# Patient Record
Sex: Female | Born: 1975 | Race: White | Hispanic: No | Marital: Single | Smoking: Current every day smoker
Health system: Southern US, Community
[De-identification: ages and names within clinical notes are randomized; demographics above are authoritative.]

## PROBLEM LIST (undated history)

## (undated) DIAGNOSIS — K219 Gastro-esophageal reflux disease without esophagitis: Secondary | ICD-10-CM

## (undated) DIAGNOSIS — E785 Hyperlipidemia, unspecified: Secondary | ICD-10-CM

## (undated) HISTORY — PX: CHOLECYSTECTOMY: SHX55

---

## 2004-07-27 ENCOUNTER — Other Ambulatory Visit: Payer: Self-pay

## 2004-07-27 ENCOUNTER — Emergency Department: Payer: Self-pay | Admitting: Emergency Medicine

## 2004-11-16 ENCOUNTER — Ambulatory Visit: Payer: Self-pay | Admitting: Unknown Physician Specialty

## 2005-01-06 ENCOUNTER — Emergency Department: Payer: Self-pay | Admitting: Emergency Medicine

## 2005-04-07 ENCOUNTER — Emergency Department: Payer: Self-pay | Admitting: Emergency Medicine

## 2005-05-18 ENCOUNTER — Emergency Department: Payer: Self-pay | Admitting: Emergency Medicine

## 2005-09-26 ENCOUNTER — Emergency Department: Payer: Self-pay | Admitting: Emergency Medicine

## 2007-04-15 ENCOUNTER — Emergency Department: Payer: Self-pay | Admitting: Emergency Medicine

## 2007-04-15 ENCOUNTER — Other Ambulatory Visit: Payer: Self-pay

## 2007-05-07 ENCOUNTER — Emergency Department: Payer: Self-pay | Admitting: Emergency Medicine

## 2007-10-15 ENCOUNTER — Emergency Department: Payer: Self-pay | Admitting: Emergency Medicine

## 2008-01-20 ENCOUNTER — Other Ambulatory Visit: Payer: Self-pay

## 2008-01-20 ENCOUNTER — Emergency Department: Payer: Self-pay | Admitting: Emergency Medicine

## 2008-03-30 ENCOUNTER — Emergency Department: Payer: Self-pay | Admitting: Emergency Medicine

## 2008-04-25 ENCOUNTER — Emergency Department: Payer: Self-pay | Admitting: Unknown Physician Specialty

## 2008-09-25 ENCOUNTER — Emergency Department: Payer: Self-pay | Admitting: Emergency Medicine

## 2009-02-09 ENCOUNTER — Emergency Department: Payer: Self-pay | Admitting: Emergency Medicine

## 2009-04-15 ENCOUNTER — Emergency Department: Payer: Self-pay | Admitting: Emergency Medicine

## 2009-10-18 ENCOUNTER — Emergency Department: Payer: Self-pay | Admitting: Emergency Medicine

## 2010-02-02 ENCOUNTER — Emergency Department: Payer: Self-pay | Admitting: Emergency Medicine

## 2010-03-10 ENCOUNTER — Emergency Department: Payer: Self-pay | Admitting: Internal Medicine

## 2010-05-04 ENCOUNTER — Emergency Department: Payer: Self-pay | Admitting: Emergency Medicine

## 2010-06-13 ENCOUNTER — Emergency Department: Payer: Self-pay | Admitting: Emergency Medicine

## 2010-07-27 ENCOUNTER — Emergency Department: Payer: Self-pay | Admitting: Emergency Medicine

## 2010-09-20 ENCOUNTER — Emergency Department: Payer: Self-pay | Admitting: Emergency Medicine

## 2010-11-10 ENCOUNTER — Observation Stay: Payer: Self-pay | Admitting: General Surgery

## 2011-02-03 ENCOUNTER — Emergency Department: Payer: Self-pay | Admitting: Emergency Medicine

## 2011-06-16 ENCOUNTER — Emergency Department: Payer: Self-pay | Admitting: *Deleted

## 2011-07-10 ENCOUNTER — Ambulatory Visit: Payer: Self-pay | Admitting: Internal Medicine

## 2011-07-12 ENCOUNTER — Ambulatory Visit: Payer: Self-pay

## 2011-07-18 ENCOUNTER — Emergency Department: Payer: Self-pay | Admitting: Emergency Medicine

## 2011-10-03 ENCOUNTER — Ambulatory Visit: Payer: Self-pay | Admitting: Gastroenterology

## 2011-10-19 ENCOUNTER — Ambulatory Visit: Payer: Self-pay

## 2011-10-22 ENCOUNTER — Emergency Department: Payer: Self-pay | Admitting: Internal Medicine

## 2011-12-08 ENCOUNTER — Emergency Department: Payer: Self-pay | Admitting: Emergency Medicine

## 2011-12-09 LAB — URINALYSIS, COMPLETE
Leukocyte Esterase: NEGATIVE
Nitrite: NEGATIVE

## 2012-03-03 ENCOUNTER — Emergency Department: Payer: Self-pay | Admitting: Emergency Medicine

## 2012-04-17 ENCOUNTER — Emergency Department: Payer: Self-pay | Admitting: Emergency Medicine

## 2012-04-17 LAB — BASIC METABOLIC PANEL
Calcium, Total: 8.8 mg/dL (ref 8.5–10.1)
Chloride: 109 mmol/L — ABNORMAL HIGH (ref 98–107)
Co2: 24 mmol/L (ref 21–32)
EGFR (Non-African Amer.): >60
Osmolality: 278 (ref 275–301)

## 2012-04-17 LAB — CBC
HGB: 14.3 g/dL (ref 12.0–16.0)
RBC: 4.77 10*6/uL (ref 3.80–5.20)

## 2012-04-17 LAB — CK TOTAL AND CKMB (NOT AT ARMC)
CK, Total: 87 U/L (ref 21–215)
CK-MB: <0.5 ng/mL — ABNORMAL LOW (ref 0.5–3.6)

## 2012-04-18 LAB — URINALYSIS, COMPLETE
Bilirubin,UR: NEGATIVE
Glucose,UR: NEGATIVE mg/dL (ref 0–75)
Nitrite: NEGATIVE
Ph: 5 (ref 4.5–8.0)
Protein: NEGATIVE
RBC,UR: 4 /HPF (ref 0–5)
WBC UR: 2 /HPF (ref 0–5)

## 2012-05-08 DIAGNOSIS — E669 Obesity, unspecified: Secondary | ICD-10-CM | POA: Insufficient documentation

## 2012-10-28 ENCOUNTER — Emergency Department: Payer: Self-pay | Admitting: Emergency Medicine

## 2012-10-29 ENCOUNTER — Emergency Department (HOSPITAL_COMMUNITY)
Admission: EM | Admit: 2012-10-29 | Discharge: 2012-10-29 | Disposition: A | Discharge status: Home / Self Care | Payer: Medicaid Other | Attending: Emergency Medicine | Admitting: Emergency Medicine

## 2012-10-29 ENCOUNTER — Encounter (HOSPITAL_COMMUNITY): Payer: Self-pay | Admitting: Emergency Medicine

## 2012-10-29 DIAGNOSIS — K0889 Other specified disorders of teeth and supporting structures: Secondary | ICD-10-CM

## 2012-10-29 DIAGNOSIS — K029 Dental caries, unspecified: Secondary | ICD-10-CM

## 2012-10-29 DIAGNOSIS — F17200 Nicotine dependence, unspecified, uncomplicated: Secondary | ICD-10-CM | POA: Insufficient documentation

## 2012-10-29 DIAGNOSIS — K08109 Complete loss of teeth, unspecified cause, unspecified class: Secondary | ICD-10-CM | POA: Insufficient documentation

## 2012-10-29 MED ORDER — IBUPROFEN 800 MG PO TABS: 800.0000 mg | ORAL_TABLET | Freq: Three times a day (TID) | ORAL | Status: DC | PRN

## 2012-10-29 MED ORDER — PENICILLIN V POTASSIUM 500 MG PO TABS: 500.0000 mg | ORAL_TABLET | Freq: Four times a day (QID) | ORAL | Status: AC

## 2012-10-29 MED ORDER — HYDROCODONE-ACETAMINOPHEN 5-325 MG PO TABS: 1.0000 | ORAL_TABLET | ORAL | Status: DC | PRN

## 2012-10-29 NOTE — ED Notes (Signed)
Pt c/o right sided dental pain x 2 days  

## 2012-10-29 NOTE — ED Provider Notes (Signed)
History    This chart was scribed for non-physician practitioner, Trixie Dredge working with Nelia Shi, MD by Smitty Pluck, ED scribe. This patient was seen in room TR04C/TR04C and the patient's care was started at 6:08 PM.   CSN: [id number]  Arrival date & time 10/29/12  1517      Chief Complaint  Patient presents with  . Dental Pain    The history is provided by the patient. No language interpreter was used.   Grace Evans is a 37 y.o. female who presents to the Emergency Department complaining of constant, moderate, aching,  right upper and lower dental pain radiating to bilateral ears onset two days ago. Pt denies trouble swallowing, fever, chills, nausea, vomiting, diarrhea, weakness, cough, SOB and any other pain. Pt reports that she does not have a dentist. She denies any alleviating factors but reports pain is aggravated by chewing food.    History reviewed. No pertinent past medical history.  History reviewed. No pertinent past surgical history.  History reviewed. No pertinent family history.  History  Substance Use Topics  . Smoking status: Current Every Day Smoker  . Smokeless tobacco: Not on file  . Alcohol Use: No    OB History   Grav Para Term Preterm Abortions TAB SAB Ect Mult Living                  Review of Systems  Constitutional: Negative for fever and chills.  HENT: Positive for dental problem. Negative for sore throat and trouble swallowing.   Respiratory: Negative for cough and shortness of breath.   Gastrointestinal: Negative for nausea, vomiting and diarrhea.  Neurological: Negative for weakness.    Allergies  Review of patient's allergies indicates no known allergies.  Home Medications  No current outpatient prescriptions on file.  BP 119/76  Pulse 82  Temp(Src) 97.8 F (36.6 C) (Oral)  Resp 18  SpO2 98%  Physical Exam  Nursing note and vitals reviewed. Constitutional: She appears well-developed and well-nourished. No distress.   HENT:  Head: Normocephalic and atraumatic.  Mouth/Throat: Uvula is midline and oropharynx is clear and moist. Mucous membranes are not dry. Abnormal dentition. Dental caries present. No dental abscesses or edematous. No oropharyngeal exudate, posterior oropharyngeal edema or tonsillar abscesses.    Widespread dental decay and several absent teeth.  Tender to palpation along gums and teeth tender to percussion on upper and lower right side.    Neck: Trachea normal and phonation normal. Neck supple. No tracheal tenderness present. No tracheal deviation present.    Pulmonary/Chest: Effort normal and breath sounds normal. No stridor. No respiratory distress. She has no wheezes. She has no rales.  No stridor   Lymphadenopathy:    She has no cervical adenopathy.  Neurological: She is alert.  Skin: She is not diaphoretic.    ED Course  Procedures (including critical care time) DIAGNOSTIC STUDIES: Oxygen Saturation is 98% on room air, normal by my interpretation.    COORDINATION OF CARE: 6:15 PM Discussed ED treatment with pt and pt agrees.     Labs Reviewed - No data to display No results found.   1. Dental caries   2. Pain, dental       MDM  Pt with severe dental decay throughout mouth.  Tender along right side, upper and lower.  No specific area that is worse than others.  No obvious dental abscess.  Will treat for infection given the sudden increase in pain.  Doubt deeper space infection  such as ludwig's angina. D/C home with pain medication, Penicillin, dental follow up.  I strongly encouraged pt to follow up with dentist.   Discussed diagnosis and treatment plan with patient.  Pt given return precautions.  Pt verbalizes understanding and agrees with plan.     I personally performed the services described in this documentation, which was scribed in my presence. The recorded information has been reviewed and is accurate.        Trixie Dredge, PA-C 10/29/12 1934

## 2012-10-30 NOTE — ED Provider Notes (Signed)
Medical screening examination/treatment/procedure(s) were performed by non-physician practitioner and as supervising physician I was immediately available for consultation/collaboration.   Nelia Shi, MD 10/30/12 2248

## 2013-01-15 ENCOUNTER — Emergency Department: Payer: Self-pay | Admitting: Emergency Medicine

## 2013-01-16 LAB — URINALYSIS, COMPLETE
Bilirubin,UR: NEGATIVE
Blood: NEGATIVE
Glucose,UR: NEGATIVE mg/dL (ref 0–75)
Ketone: NEGATIVE
Nitrite: NEGATIVE
Ph: 7 (ref 4.5–8.0)
Protein: NEGATIVE
RBC,UR: 3 /HPF (ref 0–5)
Specific Gravity: 1.03 (ref 1.003–1.030)
Squamous Epithelial: 9
WBC UR: 4 /HPF (ref 0–5)

## 2013-01-16 LAB — WET PREP, GENITAL

## 2013-02-20 ENCOUNTER — Emergency Department: Payer: Self-pay | Admitting: Emergency Medicine

## 2013-04-21 ENCOUNTER — Emergency Department: Payer: Self-pay | Admitting: Emergency Medicine

## 2013-04-27 ENCOUNTER — Emergency Department: Payer: Self-pay | Admitting: Emergency Medicine

## 2013-04-28 LAB — URINALYSIS, COMPLETE
Bilirubin,UR: NEGATIVE
Glucose,UR: NEGATIVE mg/dL (ref 0–75)
Nitrite: NEGATIVE
Ph: 6 (ref 4.5–8.0)
Protein: NEGATIVE
Squamous Epithelial: 3
WBC UR: 2 /HPF (ref 0–5)

## 2013-04-30 LAB — BETA STREP CULTURE(ARMC)

## 2013-05-08 ENCOUNTER — Other Ambulatory Visit: Payer: Self-pay | Admitting: Physician Assistant

## 2013-05-08 LAB — CBC WITH DIFFERENTIAL/PLATELET
Eosinophil %: 4.2 %
Lymphocyte #: 2.8 10*3/uL (ref 1.0–3.6)
MCH: 30.6 pg (ref 26.0–34.0)
MCHC: 35.2 g/dL (ref 32.0–36.0)
MCV: 87 fL (ref 80–100)
Monocyte %: 7.1 %
Neutrophil %: 60.3 %
Platelet: 306 10*3/uL (ref 150–440)
WBC: 10.4 10*3/uL (ref 3.6–11.0)

## 2013-05-08 LAB — COMPREHENSIVE METABOLIC PANEL
Alkaline Phosphatase: 106 U/L (ref 50–136)
Anion Gap: 5 — ABNORMAL LOW (ref 7–16)
Co2: 26 mmol/L (ref 21–32)
Creatinine: 0.71 mg/dL (ref 0.60–1.30)
EGFR (African American): >60
Glucose: 85 mg/dL (ref 65–99)
Potassium: 4.1 mmol/L (ref 3.5–5.1)
SGOT(AST): 19 U/L (ref 15–37)
Total Protein: 6.7 g/dL (ref 6.4–8.2)

## 2013-05-08 LAB — TSH: Thyroid Stimulating Horm: 0.972 u[IU]/mL

## 2013-09-26 ENCOUNTER — Emergency Department: Payer: Self-pay | Admitting: Emergency Medicine

## 2013-09-26 LAB — CBC WITH DIFFERENTIAL/PLATELET
Basophil #: 0.3 10*3/uL — ABNORMAL HIGH (ref 0.0–0.1)
Basophil %: 2.7 %
Eosinophil #: 0.5 10*3/uL (ref 0.0–0.7)
Eosinophil %: 4.8 %
HCT: 45.1 % (ref 35.0–47.0)
HGB: 15 g/dL (ref 12.0–16.0)
Lymphocyte #: 1.1 10*3/uL (ref 1.0–3.6)
Lymphocyte %: 10.4 %
MCH: 29.5 pg (ref 26.0–34.0)
MCHC: 33.2 g/dL (ref 32.0–36.0)
MCV: 89 fL (ref 80–100)
Monocyte #: 0.6 x10 3/mm (ref 0.2–0.9)
Monocyte %: 5.7 %
Neutrophil #: 7.9 10*3/uL — ABNORMAL HIGH (ref 1.4–6.5)
Neutrophil %: 76.4 %
Platelet: 252 10*3/uL (ref 150–440)
RBC: 5.09 10*6/uL (ref 3.80–5.20)
RDW: 13.2 % (ref 11.5–14.5)
WBC: 10.4 10*3/uL (ref 3.6–11.0)

## 2013-09-26 LAB — COMPREHENSIVE METABOLIC PANEL
Albumin: 3.4 g/dL (ref 3.4–5.0)
Alkaline Phosphatase: 90 U/L
Anion Gap: 6 — ABNORMAL LOW (ref 7–16)
BUN: 7 mg/dL (ref 7–18)
Bilirubin,Total: 1.1 mg/dL — ABNORMAL HIGH (ref 0.2–1.0)
Calcium, Total: 8 mg/dL — ABNORMAL LOW (ref 8.5–10.1)
Chloride: 108 mmol/L — ABNORMAL HIGH (ref 98–107)
Co2: 25 mmol/L (ref 21–32)
Creatinine: 0.71 mg/dL (ref 0.60–1.30)
EGFR (African American): >60
EGFR (Non-African Amer.): >60
Glucose: 119 mg/dL — ABNORMAL HIGH (ref 65–99)
Osmolality: 277 (ref 275–301)
Potassium: 3.4 mmol/L — ABNORMAL LOW (ref 3.5–5.1)
SGOT(AST): 24 U/L (ref 15–37)
SGPT (ALT): 26 U/L (ref 12–78)
Sodium: 139 mmol/L (ref 136–145)
Total Protein: 6.9 g/dL (ref 6.4–8.2)

## 2013-09-26 LAB — URINALYSIS, COMPLETE
Bilirubin,UR: NEGATIVE
Glucose,UR: NEGATIVE mg/dL (ref 0–75)
Ketone: NEGATIVE
Nitrite: NEGATIVE
Ph: 5 (ref 4.5–8.0)
Protein: 30
RBC,UR: 3 /HPF (ref 0–5)
Specific Gravity: 1.026 (ref 1.003–1.030)
Squamous Epithelial: 5
WBC UR: 4 /HPF (ref 0–5)

## 2013-09-26 LAB — LIPASE, BLOOD: Lipase: 70 U/L — ABNORMAL LOW (ref 73–393)

## 2013-10-08 ENCOUNTER — Emergency Department: Payer: Self-pay | Admitting: Emergency Medicine

## 2013-11-24 ENCOUNTER — Emergency Department: Payer: Self-pay | Admitting: Emergency Medicine

## 2014-02-17 ENCOUNTER — Emergency Department: Payer: Self-pay | Admitting: Emergency Medicine

## 2014-04-26 ENCOUNTER — Emergency Department: Payer: Self-pay | Admitting: Internal Medicine

## 2014-07-23 ENCOUNTER — Emergency Department: Payer: Self-pay | Admitting: Emergency Medicine

## 2014-10-02 ENCOUNTER — Emergency Department: Payer: Self-pay | Admitting: Student

## 2014-10-30 ENCOUNTER — Emergency Department: Payer: Self-pay | Admitting: Emergency Medicine

## 2014-10-31 LAB — COMPREHENSIVE METABOLIC PANEL
ANION GAP: 3 — AB (ref 7–16)
AST: 16 U/L
Albumin: 3.7 g/dL
Alkaline Phosphatase: 74 U/L
BILIRUBIN TOTAL: 0.5 mg/dL
BUN: 5 mg/dL — AB
CHLORIDE: 107 mmol/L
CREATININE: 0.74 mg/dL
Calcium, Total: 8.5 mg/dL — ABNORMAL LOW
Co2: 28 mmol/L
Glucose: 105 mg/dL — ABNORMAL HIGH
Potassium: 3.7 mmol/L
SGPT (ALT): 18 U/L
Sodium: 138 mmol/L
TOTAL PROTEIN: 6.5 g/dL

## 2014-10-31 LAB — URINALYSIS, COMPLETE
BILIRUBIN, UR: NEGATIVE
BLOOD: NEGATIVE
Glucose,UR: NEGATIVE mg/dL (ref 0–75)
Ketone: NEGATIVE
LEUKOCYTE ESTERASE: NEGATIVE
NITRITE: NEGATIVE
PH: 6 (ref 4.5–8.0)
Protein: NEGATIVE
Specific Gravity: 1.008 (ref 1.003–1.030)
WBC UR: 1 /HPF (ref 0–5)

## 2014-10-31 LAB — CBC WITH DIFFERENTIAL/PLATELET
BASOS ABS: 0.1 10*3/uL (ref 0.0–0.1)
BASOS PCT: 1.1 %
EOS ABS: 0.5 10*3/uL (ref 0.0–0.7)
Eosinophil %: 4.3 %
HCT: 43.9 % (ref 35.0–47.0)
HGB: 14.6 g/dL (ref 12.0–16.0)
LYMPHS PCT: 32.7 %
Lymphocyte #: 4.1 10*3/uL — ABNORMAL HIGH (ref 1.0–3.6)
MCH: 29.5 pg (ref 26.0–34.0)
MCHC: 33.2 g/dL (ref 32.0–36.0)
MCV: 89 fL (ref 80–100)
MONOS PCT: 7.4 %
Monocyte #: 0.9 x10 3/mm (ref 0.2–0.9)
NEUTROS PCT: 54.5 %
Neutrophil #: 6.8 10*3/uL — ABNORMAL HIGH (ref 1.4–6.5)
Platelet: 267 10*3/uL (ref 150–440)
RBC: 4.93 10*6/uL (ref 3.80–5.20)
RDW: 13.2 % (ref 11.5–14.5)
WBC: 12.5 10*3/uL — AB (ref 3.6–11.0)

## 2014-10-31 LAB — LIPASE, BLOOD: Lipase: 23 U/L

## 2014-10-31 LAB — PREGNANCY, URINE: Pregnancy Test, Urine: NEGATIVE m[IU]/mL

## 2015-01-05 ENCOUNTER — Emergency Department
Admission: EM | Admit: 2015-01-05 | Discharge: 2015-01-05 | Disposition: A | Discharge status: Home / Self Care | Payer: Self-pay | Attending: Emergency Medicine | Admitting: Emergency Medicine

## 2015-01-05 ENCOUNTER — Emergency Department: Payer: Self-pay

## 2015-01-05 ENCOUNTER — Encounter: Payer: Self-pay | Admitting: Emergency Medicine

## 2015-01-05 DIAGNOSIS — N23 Unspecified renal colic: Secondary | ICD-10-CM | POA: Insufficient documentation

## 2015-01-05 DIAGNOSIS — Z720 Tobacco use: Secondary | ICD-10-CM | POA: Insufficient documentation

## 2015-01-05 DIAGNOSIS — R109 Unspecified abdominal pain: Secondary | ICD-10-CM

## 2015-01-05 DIAGNOSIS — Z3202 Encounter for pregnancy test, result negative: Secondary | ICD-10-CM | POA: Insufficient documentation

## 2015-01-05 DIAGNOSIS — N390 Urinary tract infection, site not specified: Secondary | ICD-10-CM | POA: Insufficient documentation

## 2015-01-05 LAB — COMPREHENSIVE METABOLIC PANEL
ALBUMIN: 3.6 g/dL (ref 3.5–5.0)
ALT: 18 U/L (ref 14–54)
ANION GAP: 9 (ref 5–15)
AST: 18 U/L (ref 15–41)
Alkaline Phosphatase: 86 U/L (ref 38–126)
BILIRUBIN TOTAL: 0.7 mg/dL (ref 0.3–1.2)
BUN: 5 mg/dL — AB (ref 6–20)
CO2: 26 mmol/L (ref 22–32)
CREATININE: 0.79 mg/dL (ref 0.44–1.00)
Calcium: 8.8 mg/dL — ABNORMAL LOW (ref 8.9–10.3)
Chloride: 108 mmol/L (ref 101–111)
Glucose, Bld: 103 mg/dL — ABNORMAL HIGH (ref 65–99)
POTASSIUM: 3.4 mmol/L — AB (ref 3.5–5.1)
Sodium: 143 mmol/L (ref 135–145)
Total Protein: 6.8 g/dL (ref 6.5–8.1)

## 2015-01-05 LAB — URINALYSIS COMPLETE WITH MICROSCOPIC (ARMC ONLY)
Bilirubin Urine: NEGATIVE
Glucose, UA: NEGATIVE mg/dL
Hgb urine dipstick: NEGATIVE
Ketones, ur: NEGATIVE mg/dL
Nitrite: NEGATIVE
Protein, ur: NEGATIVE mg/dL
Specific Gravity, Urine: 1.025 (ref 1.005–1.030)
pH: 6 (ref 5.0–8.0)

## 2015-01-05 LAB — CBC WITH DIFFERENTIAL/PLATELET
Basophils Absolute: 0.1 10*3/uL (ref 0–0.1)
Basophils Relative: 1 %
Eosinophils Absolute: 0.5 10*3/uL (ref 0–0.7)
Eosinophils Relative: 6 %
HCT: 42.5 % (ref 35.0–47.0)
Hemoglobin: 14.5 g/dL (ref 12.0–16.0)
Lymphocytes Relative: 31 %
Lymphs Abs: 2.7 10*3/uL (ref 1.0–3.6)
MCH: 30 pg (ref 26.0–34.0)
MCHC: 34.1 g/dL (ref 32.0–36.0)
MCV: 88 fL (ref 80.0–100.0)
Monocytes Absolute: 0.8 10*3/uL (ref 0.2–0.9)
Monocytes Relative: 9 %
Neutro Abs: 4.7 10*3/uL (ref 1.4–6.5)
Neutrophils Relative %: 53 %
Platelets: 242 10*3/uL (ref 150–440)
RBC: 4.83 MIL/uL (ref 3.80–5.20)
RDW: 13.2 % (ref 11.5–14.5)
WBC: 8.8 10*3/uL (ref 3.6–11.0)

## 2015-01-05 LAB — LIPASE, BLOOD: Lipase: 25 U/L (ref 22–51)

## 2015-01-05 LAB — POCT PREGNANCY, URINE: Preg Test, Ur: NEGATIVE

## 2015-01-05 MED ORDER — SODIUM CHLORIDE 0.9 % IV SOLN
Freq: Once | INTRAVENOUS | Status: AC
  Administered 2015-01-05: 1000 mL via INTRAVENOUS

## 2015-01-05 MED ORDER — KETOROLAC TROMETHAMINE 30 MG/ML IJ SOLN
INTRAMUSCULAR | Status: AC
  Administered 2015-01-05: 30 mg via INTRAVENOUS
  Filled 2015-01-05: qty 1

## 2015-01-05 MED ORDER — MORPHINE SULFATE 4 MG/ML IJ SOLN
INTRAMUSCULAR | Status: AC
  Filled 2015-01-05: qty 1

## 2015-01-05 MED ORDER — KETOROLAC TROMETHAMINE 30 MG/ML IJ SOLN
30.0000 mg | Freq: Once | INTRAMUSCULAR | Status: AC
  Administered 2015-01-05: 30 mg via INTRAVENOUS

## 2015-01-05 MED ORDER — CIPROFLOXACIN HCL 500 MG PO TABS: 500.0000 mg | ORAL_TABLET | Freq: Two times a day (BID) | ORAL | Status: AC

## 2015-01-05 MED ORDER — ONDANSETRON HCL 4 MG/2ML IJ SOLN
INTRAMUSCULAR | Status: AC
  Administered 2015-01-05: 4 mg via INTRAVENOUS
  Filled 2015-01-05: qty 2

## 2015-01-05 MED ORDER — ONDANSETRON 4 MG PO TBDP: 4.0000 mg | ORAL_TABLET | Freq: Three times a day (TID) | ORAL | Status: DC | PRN

## 2015-01-05 MED ORDER — MORPHINE SULFATE 4 MG/ML IJ SOLN: 4.0000 mg | Freq: Once | INTRAMUSCULAR | Status: AC

## 2015-01-05 MED ORDER — OXYCODONE-ACETAMINOPHEN 5-325 MG PO TABS: 1.0000 | ORAL_TABLET | Freq: Three times a day (TID) | ORAL | Status: DC | PRN

## 2015-01-05 MED ORDER — ONDANSETRON HCL 4 MG/2ML IJ SOLN: 4.0000 mg | Freq: Once | INTRAMUSCULAR | Status: AC

## 2015-01-05 NOTE — Discharge Instructions (Signed)
Take medication as prescribed. Drink plenty of water and fluids. Rest. Avoid strenuous activity.  Follow-up with your primary care physician in one to 2 days. Follow-up with the above as needed.  Return to the ER for new or worsening concerns.  Urinary Tract Infection A urinary tract infection (UTI) can occur any place along the urinary tract. The tract includes the kidneys, ureters, bladder, and urethra. A type of germ called bacteria often causes a UTI. UTIs are often helped with antibiotic medicine.  HOME CARE   If given, take antibiotics as told by your doctor. Finish them even if you start to feel better.  Drink enough fluids to keep your pee (urine) clear or pale yellow.  Avoid tea, drinks with caffeine, and bubbly (carbonated) drinks.  Pee often. Avoid holding your pee in for a long time.  Pee before and after having sex (intercourse).  Wipe from front to back after you poop (bowel movement) if you are a woman. Use each tissue only once. GET HELP RIGHT AWAY IF:   You have back pain.  You have lower belly (abdominal) pain.  You have chills.  You feel sick to your stomach (nauseous).  You throw up (vomit).  Your burning or discomfort with peeing does not go away.  You have a fever.  Your symptoms are not better in 3 days. MAKE SURE YOU:   Understand these instructions.  Will watch your condition.  Will get help right away if you are not doing well or get worse. Document Released: 01/10/2008 Document Revised: 04/17/2012 Document Reviewed: 02/22/2012 [facility] Patient Information 2015 [place], [place]. This information is not intended to replace advice given to you by your health care provider. Make sure you discuss any questions you have with your health care provider.   Flank Pain Flank pain refers to pain that is located on the side of the body between the upper abdomen and the back. The pain may occur over a short period of time (acute) or may be long-term or  reoccurring (chronic). It may be mild or severe. Flank pain can be caused by many things. CAUSES  Some of the more common causes of flank pain include:  Muscle strains.   Muscle spasms.   A disease of your spine (vertebral disk disease).   A lung infection (pneumonia).   Fluid around your lungs (pulmonary edema).   A kidney infection.   Kidney stones.   A very painful skin rash caused by the chickenpox virus (shingles).   Gallbladder disease.  HOME CARE INSTRUCTIONS  Home care will depend on the cause of your pain. In general,  Rest as directed by your caregiver.  Drink enough fluids to keep your urine clear or pale yellow.  Only take over-the-counter or prescription medicines as directed by your caregiver. Some medicines may help relieve the pain.  Tell your caregiver about any changes in your pain.  Follow up with your caregiver as directed. SEEK IMMEDIATE MEDICAL CARE IF:   Your pain is not controlled with medicine.   You have new or worsening symptoms.  Your pain increases.   You have abdominal pain.   You have shortness of breath.   You have persistent nausea or vomiting.   You have swelling in your abdomen.   You feel faint or pass out.   You have blood in your urine.  You have a fever or persistent symptoms for more than 2-3 days.  You have a fever and your symptoms suddenly get worse. MAKE  SURE YOU:   Understand these instructions.  Will watch your condition.  Will get help right away if you are not doing well or get worse. Document Released: 09/14/2005 Document Revised: 04/17/2012 Document Reviewed: 03/07/2012 [facility] Patient Information 2015 [place]. This information is not intended to replace advice given to you by your health care provider. Make sure you discuss any questions you have with your health care provider.

## 2015-01-05 NOTE — ED Provider Notes (Signed)
[facility] Emergency Department Provider Note  ____________________________________________  Time seen: Approximately 7:27 PM  I have reviewed the triage vital signs and the nursing notes.   HISTORY  Chief Complaint Back Pain   HPI Grace Evans is a 39 y.o. female presents to the ER for complaints of bilateral flank pain, right greater than left. Patient states that pain has been present for approximately 2 days. Denies fall or injury. States mostly in right side. Denies movement increasing pain. States no pain to palpation.States she has had similar in past with kidney stones. States that she was diagnosed with kidney stones 3-4 months ago.  Denies fever, dysuria, vaginal discharge, vaginal bleeding, pain radiation, abdominal pain.  States pain 6 out of 10 aching.    History reviewed. No pertinent past medical history. Kidney stones There are no active problems to display for this patient.   Past Surgical History  Procedure Laterality Date  . Cholecystectomy      Current Outpatient Rx  Name  Route  Sig  Dispense  Refill  .            .         0   .         0     Allergies Review of patient's allergies indicates no known allergies.  History reviewed. No pertinent family history.  Social History History  Substance Use Topics  . Smoking status: Current Every Day Smoker  . Smokeless tobacco: Not on file  . Alcohol Use: No    Review of Systems Constitutional: No fever/chills Eyes: No visual changes. ENT: No sore throat. Cardiovascular: Denies chest pain. Respiratory: Denies shortness of breath. Gastrointestinal: No abdominal pain.  No nausea, no vomiting.  No diarrhea.  No constipation. Genitourinary: Negative for dysuria. Musculoskeletal: Positive for flank pain. Skin: Negative for rash. Neurological: Negative for headaches, focal weakness or numbness.  10-point ROS otherwise  negative.  ____________________________________________   PHYSICAL EXAM:  VITAL SIGNS: ED Triage Vitals  Enc Vitals Group     BP 01/05/15 1820 117/90 mmHg     Pulse Rate 01/05/15 1820 99     Resp 01/05/15 1820 18     Temp 01/05/15 1820 98.2 F (36.8 C)     Temp Source 01/05/15 1820 Oral     SpO2 01/05/15 1820 100 %     Weight 01/05/15 1820 215 lb (97.523 kg)     Height 01/05/15 1820 5\' 2"  (1.575 m)     Head Cir --      Peak Flow --      Pain Score 01/05/15 1820 6     Pain Loc --      Pain Edu? --      Excl. in GC? --     Constitutional: Alert and oriented. Well appearing and in no acute distress. Eyes: Conjunctivae are normal. PERRL. EOMI. Head: Atraumatic. Nose: No congestion/rhinnorhea. Mouth/Throat: Mucous membranes are moist.  Oropharynx non-erythematous. Neck: No stridor.  No cervical spine tenderness to palpation. Hematological/Lymphatic/Immunilogical: No cervical lymphadenopathy. Cardiovascular: Normal rate, regular rhythm. Grossly normal heart sounds.  Good peripheral circulation. Respiratory: Normal respiratory effort.  No retractions. Lungs CTAB. Gastrointestinal: Soft and nontender. No distention. No abdominal bruits. Mild right CVA tenderness.No left CVA tenderness. No midline tenderness or pain. No change in pain with flexion, twisting, or other range of motion. Musculoskeletal: No lower extremity tenderness nor edema.  No joint effusions. Neurologic:  Normal speech and language. No gross focal neurologic deficits  are appreciated. Speech is normal. No gait instability. Skin:  Skin is warm, dry and intact. No rash noted. Psychiatric: Mood and affect are normal. Speech and behavior are normal.  ____________________________________________   LABS (all labs ordered are listed, but only abnormal results are displayed)  Labs Reviewed  URINALYSIS COMPLETEWITH MICROSCOPIC ([facility] ONLY) - Abnormal; Notable for the following:    Color, Urine YELLOW (*)     APPearance HAZY (*)    Leukocytes, UA TRACE (*)    Bacteria, UA FEW (*)    Squamous Epithelial / LPF 6-30 (*)    All other components within normal limits RBC 6-30  COMPREHENSIVE METABOLIC PANEL - Abnormal; Notable for the following:    Potassium 3.4 (*)    Glucose, Bld 103 (*)    BUN 5 (*)    Calcium 8.8 (*)    All other components within normal limits  CBC WITH DIFFERENTIAL/PLATELET  LIPASE, BLOOD  POCT PREGNANCY, URINE   _________________________________________  RADIOLOGY  RENAL / URINARY TRACT ULTRASOUND COMPLETE  COMPARISON: None.  FINDINGS: Right Kidney:  Length: 11 cm. Echogenicity within normal limits. No mass or hydronephrosis visualized.  Left Kidney:  Length: 10.8 cm. Echogenicity within normal limits. No mass or hydronephrosis visualized.  Bladder:  Appears normal for degree of bladder distention.  IMPRESSION: No acute abnormality noted.   Electronically Signed By: Alcide Clever M.D. On: 01/05/2015 21:38  ____________________________________________   INITIAL IMPRESSION / ASSESSMENT AND PLAN / ED COURSE  Pertinent labs & imaging results that were available during my care of the patient were reviewed by me and considered in my medical decision making (see chart for details).  Presents to the ER for complaints of back pain and flank pain. Denies abdominal pain, fever, vomiting, diarrhea or other complaints. Reports history of kidney stones and this feels very similar. Denies urinary changes.tolerating PO fluids and foods well.  Ultrasound negative for acute abnormality. Patient with urinary tract infection, and suspect patient may have small stone as well. . Will treat patient with oral antibiotics. When necessary pain medication. Discussed very strict follow-up and return parameters. Patient to follow up with her physician in one to 2 days. Patient agreed to plan.   ____________________________________________   FINAL CLINICAL IMPRESSION(S)  / ED DIAGNOSES  Final diagnoses:  Flank pain  UTI (lower urinary tract infection)  Renal colic on right side      Renford Dills, NP 01/05/15 2315  Governor Rooks, MD 01/06/15 [phone]

## 2015-01-05 NOTE — ED Notes (Signed)
Bilateral flank pain and mid back pain. Denies urinary sx. Pt alert and oriented X4, active, cooperative, pt in NAD. RR even and unlabored, color WNL.

## 2015-01-05 NOTE — ED Notes (Signed)
Pt to ed with c/o bilat lower back pain and flank pain that started today.  Pt states "it catches" with certain types of movement.  Pt denies difficulty or pain with urination.  Pt appears in nad at triage.

## 2015-01-17 ENCOUNTER — Encounter (HOSPITAL_COMMUNITY): Payer: Self-pay | Admitting: *Deleted

## 2015-01-17 ENCOUNTER — Emergency Department (HOSPITAL_COMMUNITY)
Admission: EM | Admit: 2015-01-17 | Discharge: 2015-01-17 | Disposition: A | Discharge status: Home / Self Care | Payer: Medicaid Other | Attending: Emergency Medicine | Admitting: Emergency Medicine

## 2015-01-17 DIAGNOSIS — E876 Hypokalemia: Secondary | ICD-10-CM | POA: Insufficient documentation

## 2015-01-17 DIAGNOSIS — R1110 Vomiting, unspecified: Secondary | ICD-10-CM | POA: Insufficient documentation

## 2015-01-17 DIAGNOSIS — Z9049 Acquired absence of other specified parts of digestive tract: Secondary | ICD-10-CM | POA: Insufficient documentation

## 2015-01-17 DIAGNOSIS — Z720 Tobacco use: Secondary | ICD-10-CM | POA: Insufficient documentation

## 2015-01-17 DIAGNOSIS — R1013 Epigastric pain: Secondary | ICD-10-CM | POA: Insufficient documentation

## 2015-01-17 DIAGNOSIS — Z3202 Encounter for pregnancy test, result negative: Secondary | ICD-10-CM | POA: Insufficient documentation

## 2015-01-17 LAB — CBC WITH DIFFERENTIAL/PLATELET
BASOS PCT: 0 % (ref 0–1)
Basophils Absolute: 0 10*3/uL (ref 0.0–0.1)
EOS ABS: 0.5 10*3/uL (ref 0.0–0.7)
Eosinophils Relative: 4 % (ref 0–5)
HCT: 44.6 % (ref 36.0–46.0)
Hemoglobin: 15.4 g/dL — ABNORMAL HIGH (ref 12.0–15.0)
Lymphocytes Relative: 23 % (ref 12–46)
Lymphs Abs: 3.1 10*3/uL (ref 0.7–4.0)
MCH: 29.4 pg (ref 26.0–34.0)
MCHC: 34.5 g/dL (ref 30.0–36.0)
MCV: 85.3 fL (ref 78.0–100.0)
Monocytes Absolute: 0.8 10*3/uL (ref 0.1–1.0)
Monocytes Relative: 6 % (ref 3–12)
NEUTROS PCT: 67 % (ref 43–77)
Neutro Abs: 8.9 10*3/uL — ABNORMAL HIGH (ref 1.7–7.7)
Platelets: 304 10*3/uL (ref 150–400)
RBC: 5.23 MIL/uL — ABNORMAL HIGH (ref 3.87–5.11)
RDW: 12.5 % (ref 11.5–15.5)
WBC: 13.3 10*3/uL — ABNORMAL HIGH (ref 4.0–10.5)

## 2015-01-17 LAB — URINALYSIS, ROUTINE W REFLEX MICROSCOPIC
Glucose, UA: NEGATIVE mg/dL
Hgb urine dipstick: NEGATIVE
Ketones, ur: 15 mg/dL — AB
Nitrite: NEGATIVE
PROTEIN: 30 mg/dL — AB
SPECIFIC GRAVITY, URINE: 1.04 — AB (ref 1.005–1.030)
Urobilinogen, UA: 1 mg/dL (ref 0.0–1.0)
pH: 5.5 (ref 5.0–8.0)

## 2015-01-17 LAB — COMPREHENSIVE METABOLIC PANEL
ALBUMIN: 3.4 g/dL — AB (ref 3.5–5.0)
ALT: 17 U/L (ref 14–54)
AST: 21 U/L (ref 15–41)
Alkaline Phosphatase: 84 U/L (ref 38–126)
Anion gap: 10 (ref 5–15)
BUN: <5 mg/dL — ABNORMAL LOW (ref 6–20)
CHLORIDE: 107 mmol/L (ref 101–111)
CO2: 20 mmol/L — AB (ref 22–32)
Calcium: 8.7 mg/dL — ABNORMAL LOW (ref 8.9–10.3)
Creatinine, Ser: 0.71 mg/dL (ref 0.44–1.00)
GFR calc Af Amer: >60 mL/min (ref >60)
Glucose, Bld: 152 mg/dL — ABNORMAL HIGH (ref 65–99)
Potassium: 3 mmol/L — ABNORMAL LOW (ref 3.5–5.1)
Sodium: 137 mmol/L (ref 135–145)
TOTAL PROTEIN: 6.3 g/dL — AB (ref 6.5–8.1)
Total Bilirubin: 0.6 mg/dL (ref 0.3–1.2)

## 2015-01-17 LAB — URINE MICROSCOPIC-ADD ON

## 2015-01-17 LAB — LIPASE, BLOOD: Lipase: 16 U/L — ABNORMAL LOW (ref 22–51)

## 2015-01-17 LAB — POC URINE PREG, ED: Preg Test, Ur: NEGATIVE

## 2015-01-17 MED ORDER — OMEPRAZOLE 20 MG PO CPDR: 20.0000 mg | DELAYED_RELEASE_CAPSULE | Freq: Every day | ORAL | Status: DC

## 2015-01-17 MED ORDER — PANTOPRAZOLE SODIUM 20 MG PO TBEC
20.0000 mg | DELAYED_RELEASE_TABLET | Freq: Once | ORAL | Status: AC
  Administered 2015-01-17: 20 mg via ORAL
  Filled 2015-01-17: qty 1

## 2015-01-17 MED ORDER — ONDANSETRON 4 MG PO TBDP
8.0000 mg | ORAL_TABLET | Freq: Once | ORAL | Status: AC
  Administered 2015-01-17: 8 mg via ORAL
  Filled 2015-01-17: qty 2

## 2015-01-17 MED ORDER — OXYCODONE-ACETAMINOPHEN 5-325 MG PO TABS
1.0000 | ORAL_TABLET | Freq: Once | ORAL | Status: AC
  Administered 2015-01-17: 1 via ORAL
  Filled 2015-01-17: qty 1

## 2015-01-17 MED ORDER — GI COCKTAIL ~~LOC~~
30.0000 mL | Freq: Once | ORAL | Status: AC
  Administered 2015-01-17: 30 mL via ORAL
  Filled 2015-01-17: qty 30

## 2015-01-17 MED ORDER — POTASSIUM CHLORIDE ER 10 MEQ PO TBCR: 20.0000 meq | EXTENDED_RELEASE_TABLET | Freq: Two times a day (BID) | ORAL | Status: DC

## 2015-01-17 NOTE — ED Notes (Signed)
Pt stable, ambulatory, states understanding of discharge instructions 

## 2015-01-17 NOTE — ED Notes (Signed)
Pt ambulated to restroom. 

## 2015-01-17 NOTE — ED Provider Notes (Deleted)
39 year old female comes in with two-day history of epigastric pain. She has been having problems with persistent vomiting and had been advised to go on a proton pump inhibitor but did not get the prescription filled because she thought it was too expensive. On exam, there is some mild to moderate epigastric tenderness but no rebound or guarding. Bowel sounds are decreased. Laboratory workup does show mild leukocytosis which is unchanged from her baseline and there is no left shift. Potassium is low at 3.0 probably related to her known problems with vomiting and she will need potassium supplementation. She is advised to take omeprazole which is unavailable for $4 per month and [facility]  I saw and evaluated the patient, reviewed the resident's note and I agree with the findings and plan.   EKG Interpretation   Date/Time:  Sunday January 17 2015 16:19:10 EDT Ventricular Rate:  98 PR Interval:  130 QRS Duration: 80 QT Interval:  368 QTC Calculation: 469 R Axis:   -33 Text Interpretation:  Normal sinus rhythm Left axis deviation Possible  Inferior infarct , age undetermined Cannot rule out Anterior infarct , age  undetermined Abnormal ECG When compared with ECG of 09/26/2013, No  significant change was found Confirmed by [facility]  MD, Lidya Mccalister ([id number]) on  01/17/2015 7:57:16 PM        Dione Booze, MD 01/17/15 2002

## 2015-01-17 NOTE — ED Notes (Signed)
The pt is c/o epigastric pain with nv.  No gb  .  She has these attacks since her gb was removed  lmp none  bc

## 2015-01-17 NOTE — ED Provider Notes (Signed)
CSN: [id number]     Arrival date & time 01/17/15  1602 History   First MD Initiated Contact with Patient 01/17/15 1837     Chief Complaint  Patient presents with  . Abdominal Pain     (Consider location/radiation/quality/duration/timing/severity/associated sxs/prior Treatment) Patient is a 39 y.o. female presenting with abdominal pain. The history is provided by the patient.  Abdominal Pain Pain location:  Epigastric Pain quality: aching   Pain radiates to:  Does not radiate Pain severity:  Moderate Onset quality:  Gradual Duration:  1 day Timing:  Constant Progression:  Worsening Chronicity:  New Context: previous surgery (chole 2 y/a)   Context: not alcohol use and not eating   Relieved by:  Nothing Worsened by:  Nothing tried Ineffective treatments:  None tried Associated symptoms: vomiting (daily for 2-3 years)   Associated symptoms: no anorexia, no constipation, no diarrhea and no fever     History reviewed. No pertinent past medical history. Past Surgical History  Procedure Laterality Date  . Cholecystectomy     No family history on file. History  Substance Use Topics  . Smoking status: Current Every Day Smoker  . Smokeless tobacco: Not on file  . Alcohol Use: No   OB History    Gravida Para Term Preterm AB TAB SAB Ectopic Multiple Living   2    1     1      Review of Systems  Constitutional: Negative for fever.  Gastrointestinal: Positive for vomiting (daily for 2-3 years) and abdominal pain. Negative for diarrhea, constipation and anorexia.  All other systems reviewed and are negative.     Allergies  Review of patient's allergies indicates no known allergies.  Home Medications   Prior to Admission medications   Medication Sig Start Date End Date Taking? Authorizing Provider  omeprazole (PRILOSEC) 20 MG capsule Take 1 capsule (20 mg total) by mouth daily. 01/17/15   Lyndal Pulley, MD  ondansetron (ZOFRAN ODT) 4 MG disintegrating tablet Take 1 tablet  (4 mg total) by mouth every 8 (eight) hours as needed for nausea or vomiting. 01/05/15   Renford Dills, NP  oxyCODONE-acetaminophen (ROXICET) 5-325 MG per tablet Take 1 tablet by mouth every 8 (eight) hours as needed for moderate pain or severe pain (Do not drive or operate heavy machinery while taking as can cause drowsiness.). 01/05/15   Renford Dills, NP  potassium chloride (K-DUR) 10 MEQ tablet Take 2 tablets (20 mEq total) by mouth 2 (two) times daily. 01/17/15   Lyndal Pulley, MD   BP 107/78 mmHg  Pulse 71  Temp(Src) 98 F (36.7 C) (Oral)  Resp 14  Ht 5\' 2"  (1.575 m)  Wt 208 lb 1.6 oz (94.394 kg)  BMI 38.05 kg/m2  SpO2 100% Physical Exam  Constitutional: She is oriented to person, place, and time. She appears well-developed and well-nourished. No distress.  HENT:  Head: Normocephalic.  Eyes: Conjunctivae are normal.  Neck: Neck supple. No tracheal deviation present.  Cardiovascular: Normal rate and regular rhythm.   Pulmonary/Chest: Effort normal. No respiratory distress.  Abdominal: Soft. She exhibits no distension. There is tenderness in the epigastric area. There is no rigidity and no guarding.  Neurological: She is alert and oriented to person, place, and time.  Skin: Skin is warm and dry.  Psychiatric: She has a normal mood and affect.    ED Course  Procedures (including critical care time) Labs Review Labs Reviewed  CBC WITH DIFFERENTIAL/PLATELET - Abnormal; Notable for the following:  WBC 13.3 (*)    RBC 5.23 (*)    Hemoglobin 15.4 (*)    Neutro Abs 8.9 (*)    All other components within normal limits  COMPREHENSIVE METABOLIC PANEL - Abnormal; Notable for the following:    Potassium 3.0 (*)    CO2 20 (*)    Glucose, Bld 152 (*)    BUN <5 (*)    Calcium 8.7 (*)    Total Protein 6.3 (*)    Albumin 3.4 (*)    All other components within normal limits  LIPASE, BLOOD - Abnormal; Notable for the following:    Lipase 16 (*)    All other components within normal  limits  URINALYSIS, ROUTINE W REFLEX MICROSCOPIC (NOT AT [facility]) - Abnormal; Notable for the following:    Color, Urine ORANGE (*)    APPearance CLOUDY (*)    Specific Gravity, Urine 1.040 (*)    Bilirubin Urine SMALL (*)    Ketones, ur 15 (*)    Protein, ur 30 (*)    Leukocytes, UA TRACE (*)    All other components within normal limits  URINE MICROSCOPIC-ADD ON - Abnormal; Notable for the following:    Squamous Epithelial / LPF FEW (*)    All other components within normal limits  POC URINE PREG, ED    Imaging Review No results found.   EKG Interpretation   Date/Time:  Sunday January 17 2015 16:19:10 EDT Ventricular Rate:  98 PR Interval:  130 QRS Duration: 80 QT Interval:  368 QTC Calculation: 469 R Axis:   -33 Text Interpretation:  Normal sinus rhythm Left axis deviation Possible  Inferior infarct , age undetermined Cannot rule out Anterior infarct , age  undetermined Abnormal ECG When compared with ECG of 09/26/2013, No  significant change was found Confirmed by [facility]  MD, DAVID ([id number]) on  01/17/2015 7:57:16 PM      MDM   Final diagnoses:  Epigastric abdominal pain  Hypokalemia   39 year old female presents with epigastric abdominal pain that has been occurring intermittently for the last 2 days. She has had a stated history of 2-3 years of daily Singulair episodes of vomiting. She has been seen by multiple providers and prescribed a PPI but has been noncompliant with this therapy due to perceived cost difficulty. I splinted the patient that her symptoms are consistent with reflux related etiologies for pain including peptic ulcer disease or reflux esophagitis but she has no current signs of peritonitis, no significant lab abnormalities except for mild hypokalemia which we will replete orally and I recommended that she establish a primary care physician and start taking a PPI as previously instructed which is available at low cost.   Lyndal Pulley, MD 01/18/15  0107  Dione Booze, MD 01/18/15 1505

## 2015-01-17 NOTE — ED Notes (Signed)
The pt reports that she has been all over with these same symptoms  She vomits every am

## 2015-01-17 NOTE — Discharge Instructions (Signed)
Abdominal Pain °Many things can cause abdominal pain. Usually, abdominal pain is not caused by a disease and will improve without treatment. It can often be observed and treated at home. Your health care provider will do a physical exam and possibly order blood tests and X-rays to help determine the seriousness of your pain. However, in many cases, more time must pass before a clear cause of the pain can be found. Before that point, your health care provider may not know if you need more testing or further treatment. °HOME CARE INSTRUCTIONS  °Monitor your abdominal pain for any changes. The following actions may help to alleviate any discomfort you are experiencing: °· Only take over-the-counter or prescription medicines as directed by your health care provider. °· Do not take laxatives unless directed to do so by your health care provider. °· Try a clear liquid diet (broth, tea, or water) as directed by your health care provider. Slowly move to a bland diet as tolerated. °SEEK MEDICAL CARE IF: °· You have unexplained abdominal pain. °· You have abdominal pain associated with nausea or diarrhea. °· You have pain when you urinate or have a bowel movement. °· You experience abdominal pain that wakes you in the night. °· You have abdominal pain that is worsened or improved by eating food. °· You have abdominal pain that is worsened with eating fatty foods. °· You have a fever. °SEEK IMMEDIATE MEDICAL CARE IF:  °· Your pain does not go away within 2 hours. °· You keep throwing up (vomiting). °· Your pain is felt only in portions of the abdomen, such as the right side or the left lower portion of the abdomen. °· You pass bloody or black tarry stools. °MAKE SURE YOU: °· Understand these instructions.   °· Will watch your condition.   °· Will get help right away if you are not doing well or get worse.   °Document Released: 05/03/2005 Document Revised: 07/29/2013 Document Reviewed: 04/02/2013 °ExitCare® Patient Information  ©2015 [employer]. This information is not intended to replace advice given to you by your health care provider. Make sure you discuss any questions you have with your health care provider. ° °Gastroesophageal Reflux Disease, Adult °Gastroesophageal reflux disease (GERD) happens when acid from your stomach flows up into the esophagus. When acid comes in contact with the esophagus, the acid causes soreness (inflammation) in the esophagus. Over time, GERD may create small holes (ulcers) in the lining of the esophagus. °CAUSES  °· Increased body weight. This puts pressure on the stomach, making acid rise from the stomach into the esophagus. °· Smoking. This increases acid production in the stomach. °· Drinking alcohol. This causes decreased pressure in the lower esophageal sphincter (valve or ring of muscle between the esophagus and stomach), allowing acid from the stomach into the esophagus. °· Late evening meals and a full stomach. This increases pressure and acid production in the stomach. °· A malformed lower esophageal sphincter. °Sometimes, no cause is found. °SYMPTOMS  °· Burning pain in the lower part of the mid-chest behind the breastbone and in the mid-stomach area. This may occur twice a week or more often. °· Trouble swallowing. °· Sore throat. °· Dry cough. °· Asthma-like symptoms including chest tightness, shortness of breath, or wheezing. °DIAGNOSIS  °Your caregiver may be able to diagnose GERD based on your symptoms. In some cases, X-rays and other tests may be done to check for complications or to check the condition of your stomach and esophagus. °TREATMENT  °Your caregiver may   recommend over-the-counter or prescription medicines to help decrease acid production. Ask your caregiver before starting or adding any new medicines.  °HOME CARE INSTRUCTIONS  °· Change the factors that you can control. Ask your caregiver for guidance concerning weight loss, quitting smoking, and alcohol  consumption. °· Avoid foods and drinks that make your symptoms worse, such as: °¨ Caffeine or alcoholic drinks. °¨ Chocolate. °¨ Peppermint or mint flavorings. °¨ Garlic and onions. °¨ Spicy foods. °¨ Citrus fruits, such as oranges, lemons, or limes. °¨ Tomato-based foods such as sauce, chili, salsa, and pizza. °¨ Fried and fatty foods. °· Avoid lying down for the 3 hours prior to your bedtime or prior to taking a nap. °· Eat small, frequent meals instead of large meals. °· Wear loose-fitting clothing. Do not wear anything tight around your waist that causes pressure on your stomach. °· Raise the head of your bed 6 to 8 inches with wood blocks to help you sleep. Extra pillows will not help. °· Only take over-the-counter or prescription medicines for pain, discomfort, or fever as directed by your caregiver. °· Do not take aspirin, ibuprofen, or other nonsteroidal anti-inflammatory drugs (NSAIDs). °SEEK IMMEDIATE MEDICAL CARE IF:  °· You have pain in your arms, neck, jaw, teeth, or back. °· Your pain increases or changes in intensity or duration. °· You develop nausea, vomiting, or sweating (diaphoresis). °· You develop shortness of breath, or you faint. °· Your vomit is green, yellow, black, or looks like coffee grounds or blood. °· Your stool is red, bloody, or black. °These symptoms could be signs of other problems, such as heart disease, gastric bleeding, or esophageal bleeding. °MAKE SURE YOU:  °· Understand these instructions. °· Will watch your condition. °· Will get help right away if you are not doing well or get worse. °Document Released: 05/03/2005 Document Revised: 10/16/2011 Document Reviewed: 02/10/2011 °ExitCare® Patient Information ©2015 [employer]. This information is not intended to replace advice given to you by your health care provider. Make sure you discuss any questions you have with your health care provider. ° °

## 2015-07-06 ENCOUNTER — Emergency Department
Admission: EM | Admit: 2015-07-06 | Discharge: 2015-07-06 | Disposition: A | Discharge status: Home / Self Care | Payer: Medicaid Other | Attending: Emergency Medicine | Admitting: Emergency Medicine

## 2015-07-06 ENCOUNTER — Encounter: Payer: Self-pay | Admitting: Emergency Medicine

## 2015-07-06 ENCOUNTER — Emergency Department: Payer: Medicaid Other

## 2015-07-06 DIAGNOSIS — H6592 Unspecified nonsuppurative otitis media, left ear: Secondary | ICD-10-CM | POA: Insufficient documentation

## 2015-07-06 DIAGNOSIS — Z79899 Other long term (current) drug therapy: Secondary | ICD-10-CM | POA: Insufficient documentation

## 2015-07-06 DIAGNOSIS — J029 Acute pharyngitis, unspecified: Secondary | ICD-10-CM

## 2015-07-06 DIAGNOSIS — H65192 Other acute nonsuppurative otitis media, left ear: Secondary | ICD-10-CM

## 2015-07-06 DIAGNOSIS — F17210 Nicotine dependence, cigarettes, uncomplicated: Secondary | ICD-10-CM | POA: Insufficient documentation

## 2015-07-06 HISTORY — DX: Gastro-esophageal reflux disease without esophagitis: K21.9

## 2015-07-06 LAB — CBC
HCT: 41.5 % (ref 35.0–47.0)
Hemoglobin: 13.6 g/dL (ref 12.0–16.0)
MCH: 28.3 pg (ref 26.0–34.0)
MCHC: 32.9 g/dL (ref 32.0–36.0)
MCV: 86.2 fL (ref 80.0–100.0)
Platelets: 274 10*3/uL (ref 150–440)
RBC: 4.81 MIL/uL (ref 3.80–5.20)
RDW: 13.7 % (ref 11.5–14.5)
WBC: 11.2 10*3/uL — ABNORMAL HIGH (ref 3.6–11.0)

## 2015-07-06 LAB — BASIC METABOLIC PANEL
Anion gap: 7 (ref 5–15)
BUN: 7 mg/dL (ref 6–20)
CO2: 24 mmol/L (ref 22–32)
Calcium: 8.5 mg/dL — ABNORMAL LOW (ref 8.9–10.3)
Chloride: 108 mmol/L (ref 101–111)
Creatinine, Ser: 0.49 mg/dL (ref 0.44–1.00)
GFR calc Af Amer: >60 mL/min (ref >60)
GLUCOSE: 122 mg/dL — AB (ref 65–99)
Potassium: 3.5 mmol/L (ref 3.5–5.1)
Sodium: 139 mmol/L (ref 135–145)

## 2015-07-06 LAB — TROPONIN I: Troponin I: <0.03 ng/mL (ref <0.031)

## 2015-07-06 MED ORDER — AZITHROMYCIN 250 MG PO TABS: ORAL_TABLET | ORAL | Status: DC

## 2015-07-06 MED ORDER — IBUPROFEN 800 MG PO TABS: 800.0000 mg | ORAL_TABLET | Freq: Three times a day (TID) | ORAL | Status: DC | PRN

## 2015-07-06 NOTE — ED Notes (Signed)
C/o left ear pain radiating to throat.  Onset of symptoms 2 days.  Denies cough.  Also intermittent c/o chest pain ("for a while").  Denies Shortness of breath.

## 2015-07-06 NOTE — Discharge Instructions (Signed)
Otitis Media, Adult Otitis media is redness, soreness, and puffiness (swelling) in the space just behind your eardrum (middle ear). It may be caused by allergies or infection. It often happens along with a cold. HOME CARE  Take your medicine as told. Finish it even if you start to feel better.  Only take over-the-counter or prescription medicines for pain, discomfort, or fever as told by your doctor.  Follow up with your doctor as told. GET HELP IF:  You have otitis media only in one ear, or bleeding from your nose, or both.  You notice a lump on your neck.  You are not getting better in 3-5 days.  You feel worse instead of better. GET HELP RIGHT AWAY IF:   You have pain that is not helped with medicine.  You have puffiness, redness, or pain around your ear.  You get a stiff neck.  You cannot move part of your face (paralysis).  You notice that the bone behind your ear hurts when you touch it. MAKE SURE YOU:   Understand these instructions.  Will watch your condition.  Will get help right away if you are not doing well or get worse.   This information is not intended to replace advice given to you by your health care provider. Make sure you discuss any questions you have with your health care provider.   Document Released: 01/10/2008 Document Revised: 08/14/2014 Document Reviewed: 02/18/2013 Elsevier Interactive Patient Education 2016 [employer]  Pharyngitis Pharyngitis is redness, pain, and swelling (inflammation) of your pharynx.  CAUSES  Pharyngitis is usually caused by infection. Most of the time, these infections are from viruses (viral) and are part of a cold. However, sometimes pharyngitis is caused by bacteria (bacterial). Pharyngitis can also be caused by allergies. Viral pharyngitis may be spread from person to person by coughing, sneezing, and personal items or utensils (cups, forks, spoons, toothbrushes). Bacterial pharyngitis may be spread from person to  person by more intimate contact, such as kissing.  SIGNS AND SYMPTOMS  Symptoms of pharyngitis include:   Sore throat.   Tiredness (fatigue).   Low-grade fever.   Headache.  Joint pain and muscle aches.  Skin rashes.  Swollen lymph nodes.  Plaque-like film on throat or tonsils (often seen with bacterial pharyngitis). DIAGNOSIS  Your health care provider will ask you questions about your illness and your symptoms. Your medical history, along with a physical exam, is often all that is needed to diagnose pharyngitis. Sometimes, a rapid strep test is done. Other lab tests may also be done, depending on the suspected cause.  TREATMENT  Viral pharyngitis will usually get better in 3-4 days without the use of medicine. Bacterial pharyngitis is treated with medicines that kill germs (antibiotics).  HOME CARE INSTRUCTIONS   Drink enough water and fluids to keep your urine clear or pale yellow.   Only take over-the-counter or prescription medicines as directed by your health care provider:   If you are prescribed antibiotics, make sure you finish them even if you start to feel better.   Do not take aspirin.   Get lots of rest.   Gargle with 8 oz of salt water ( tsp of salt per 1 qt of water) as often as every 1-2 hours to soothe your throat.   Throat lozenges (if you are not at risk for choking) or sprays may be used to soothe your throat. SEEK MEDICAL CARE IF:   You have large, tender lumps in your neck.  You  have a rash.  You cough up green, yellow-brown, or bloody spit. SEEK IMMEDIATE MEDICAL CARE IF:   Your neck becomes stiff.  You drool or are unable to swallow liquids.  You vomit or are unable to keep medicines or liquids down.  You have severe pain that does not go away with the use of recommended medicines.  You have trouble breathing (not caused by a stuffy nose). MAKE SURE YOU:   Understand these instructions.  Will watch your condition.  Will  get help right away if you are not doing well or get worse.   This information is not intended to replace advice given to you by your health care provider. Make sure you discuss any questions you have with your health care provider.   Document Released: 07/24/2005 Document Revised: 05/14/2013 Document Reviewed: 03/31/2013 Elsevier Interactive Patient Education [employer]

## 2015-07-06 NOTE — ED Notes (Signed)
C/o left ear pain and throat pain X 3 days.  Intermittent left sided achy CP X 2 weeks.

## 2015-07-06 NOTE — ED Provider Notes (Signed)
[facility] Emergency Department Provider Note  ____________________________________________  Time seen: Approximately 4:06 PM  I have reviewed the triage vital signs and the nursing notes.   HISTORY  Chief Complaint Otalgia   HPI Grace Evans is a 39 y.o. female who presents for evaluation of left ear pain and sore throat with some chest pain for the last 2-3 days.Patient denies any nausea or vomiting. Denies any cough at this time. Patient denies any shortness of breath   Past Medical History  Diagnosis Date  . GERD (gastroesophageal reflux disease)     There are no active problems to display for this patient.   Past Surgical History  Procedure Laterality Date  . Cholecystectomy      Current Outpatient Rx  Name  Route  Sig  Dispense  Refill  . azithromycin (ZITHROMAX Z-PAK) 250 MG tablet      Take 2 tablets (500 mg) on  Day 1,  followed by 1 tablet (250 mg) once daily on Days 2 through 5.   6 each   0   . ibuprofen (ADVIL,MOTRIN) 800 MG tablet   Oral   Take 1 tablet (800 mg total) by mouth every 8 (eight) hours as needed.   30 tablet   0   . omeprazole (PRILOSEC) 20 MG capsule   Oral   Take 1 capsule (20 mg total) by mouth daily.   30 capsule   0   . ondansetron (ZOFRAN ODT) 4 MG disintegrating tablet   Oral   Take 1 tablet (4 mg total) by mouth every 8 (eight) hours as needed for nausea or vomiting.   12 tablet   0   . oxyCODONE-acetaminophen (ROXICET) 5-325 MG per tablet   Oral   Take 1 tablet by mouth every 8 (eight) hours as needed for moderate pain or severe pain (Do not drive or operate heavy machinery while taking as can cause drowsiness.).   9 tablet   0   . potassium chloride (K-DUR) 10 MEQ tablet   Oral   Take 2 tablets (20 mEq total) by mouth 2 (two) times daily.   20 tablet   0     Allergies Review of patient's allergies indicates no known allergies.  No family history on file.  Social History Social  History  Substance Use Topics  . Smoking status: Current Every Day Smoker -- 1.00 packs/day    Types: Cigarettes  . Smokeless tobacco: None  . Alcohol Use: No    Review of Systems Constitutional: No fever/chills Eyes: No visual changes. ENT: Positive sore throat. Positive for left ear pain Cardiovascular: Occasional, nonradiating, left-sided chest pain. Respiratory: Denies shortness of breath. Gastrointestinal: No abdominal pain.  No nausea, no vomiting.  No diarrhea.  No constipation. Genitourinary: Negative for dysuria. Musculoskeletal: Negative for back pain. Skin: Negative for rash. Neurological: Negative for headaches, focal weakness or numbness.  10-point ROS otherwise negative.  ____________________________________________   PHYSICAL EXAM:  VITAL SIGNS: ED Triage Vitals  Enc Vitals Group     BP 07/06/15 1447 116/73 mmHg     Pulse Rate 07/06/15 1447 98     Resp 07/06/15 1447 18     Temp 07/06/15 1447 98.2 F (36.8 C)     Temp src --      SpO2 07/06/15 1447 96 %     Weight 07/06/15 1447 215 lb (97.523 kg)     Height 07/06/15 1447  (1.6 m)     Head Cir --  Peak Flow --      Pain Score 07/06/15 1451 6     Pain Loc --      Pain Edu? --      Excl. in GC? --     Constitutional: Alert and oriented. Well appearing and in no acute distress. Eyes: Conjunctivae are normal. PERRL. EOMI. Head: Atraumatic. Left TM erythematous. Nose: No congestion/rhinnorhea. Mouth/Throat: Mucous membranes are moist.  Oropharynx slightly erythematous Neck: No stridor. Positive left-sided cervical adenopathy  Cardiovascular: Normal rate, regular rhythm. Grossly normal heart sounds.  Good peripheral circulation. Respiratory: Normal respiratory effort.  No retractions. Lungs CTAB. Gastrointestinal: Soft and nontender. No distention. No abdominal bruits. No CVA tenderness. Musculoskeletal: No lower extremity tenderness nor edema.  No joint effusions. Neurologic:  Normal speech  and language. No gross focal neurologic deficits are appreciated. No gait instability. Skin:  Skin is warm, dry and intact. No rash noted. Psychiatric: Mood and affect are normal. Speech and behavior are normal.  ____________________________________________   LABS (all labs ordered are listed, but only abnormal results are displayed)  Labs Reviewed  BASIC METABOLIC PANEL - Abnormal; Notable for the following:    Glucose, Bld 122 (*)    Calcium 8.5 (*)    All other components within normal limits  CBC - Abnormal; Notable for the following:    WBC 11.2 (*)    All other components within normal limits  TROPONIN I   ____________________________________________  EKG  Normal sinus rhythm no acute STEMI. ____________________________________________  RADIOLOGY  No consolidation or atelectasis ____________________________________________   PROCEDURES  Procedure(s) performed: None  Critical Care performed: No  ____________________________________________   INITIAL IMPRESSION / ASSESSMENT AND PLAN / ED COURSE  Pertinent labs & imaging results that were available during my care of the patient were reviewed by me and considered in my medical decision making (see chart for details).  Acute left otitis media with the pharyngitis. Rx given for Z-Pak and Motrin 800 mg 3 times a day. Patient follow-up PCP or return to the ER with any worsening symptomology. Patient voices no other emergency medical complaints at this time. ____________________________________________   FINAL CLINICAL IMPRESSION(S) / ED DIAGNOSES  Final diagnoses:  Acute nonsuppurative otitis media of left ear  Acute pharyngitis, unspecified etiology      Evangeline DakinCharles M Shayon Trompeter, PA-C 07/06/15 1630  Sharman CheekPhillip Stafford, MD 07/07/15 2141

## 2015-07-22 IMAGING — US US RENAL
1 series · 14 of 25 positions shown · non-contrast
Comparison: None.

CLINICAL DATA: Flank pain

EXAM:
RENAL / URINARY TRACT ULTRASOUND COMPLETE

[Series 1: us renal · 0.24mm/px · 14 of 35 slices shown]
[im 1/35]
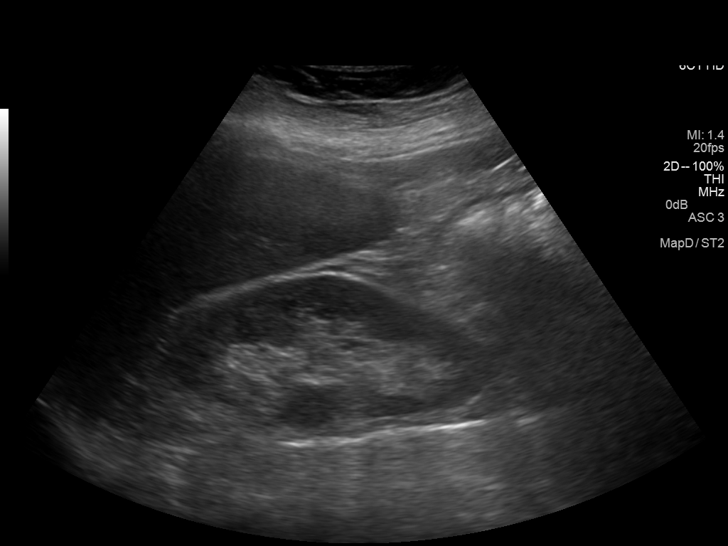
[im 3/35]
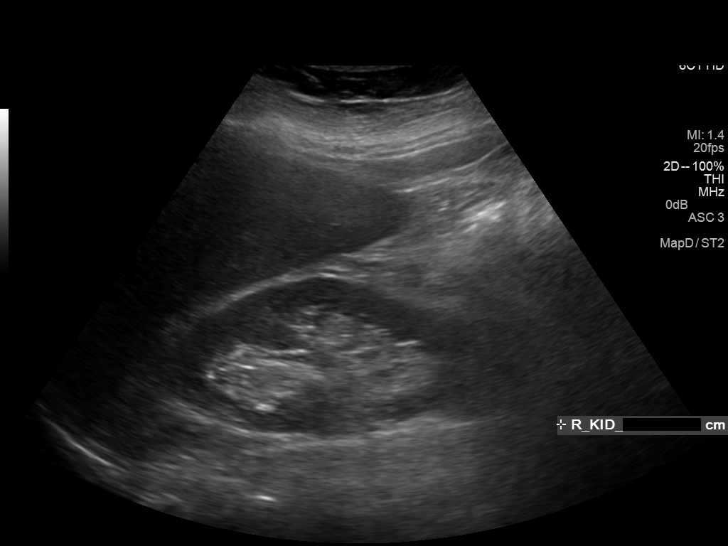
[im 6/35]
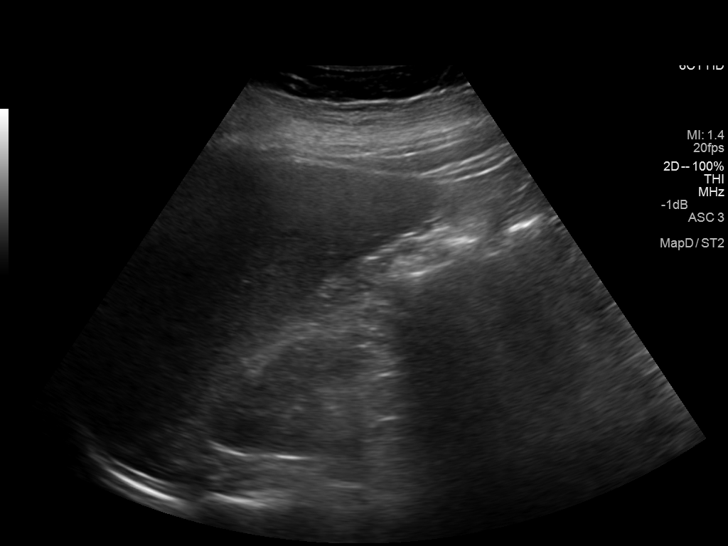
[im 9/35]
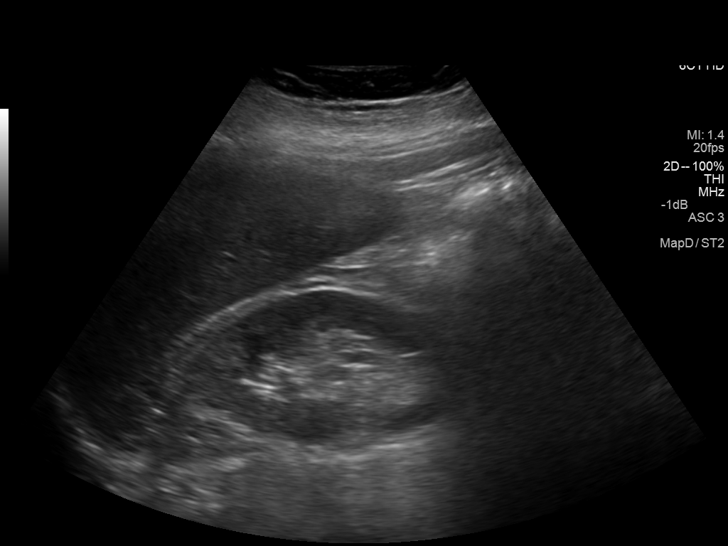
[im 12/35]
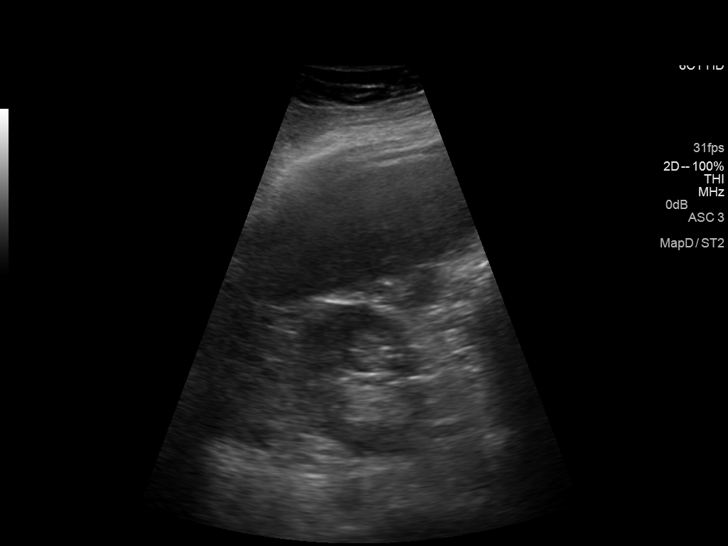
[im 13/35]
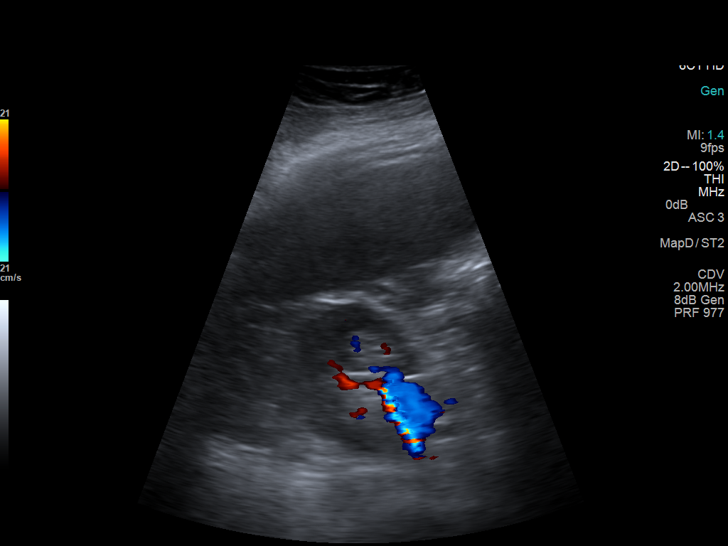
[im 16/35]
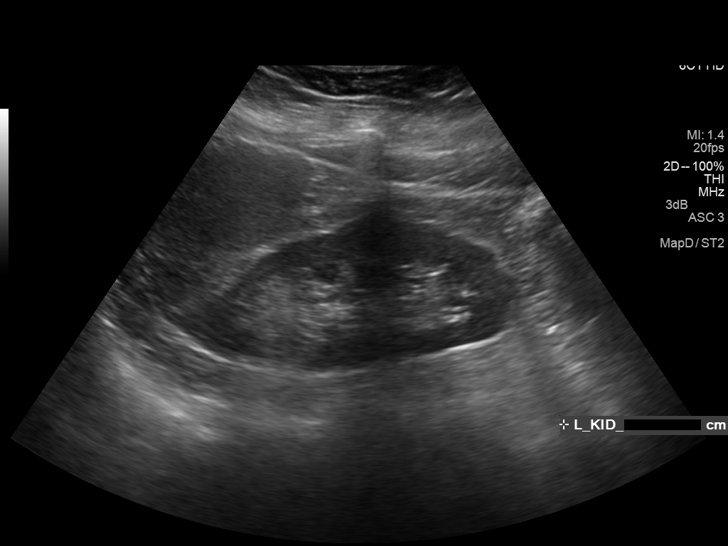
[im 19/35]
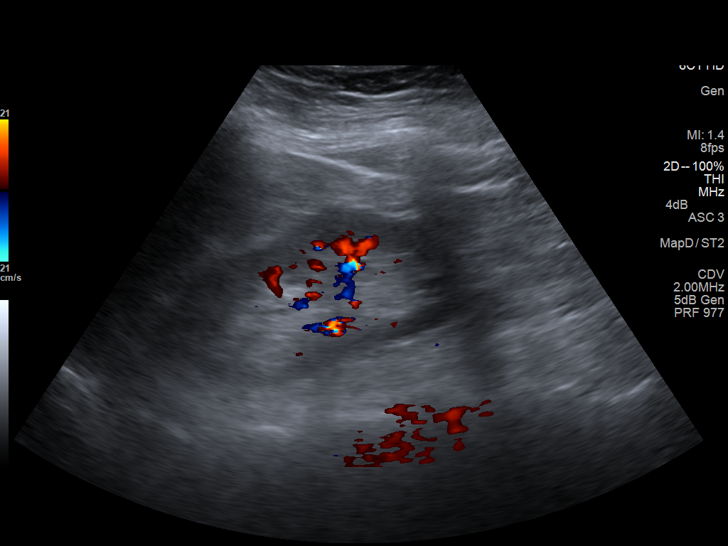
[im 22/35]
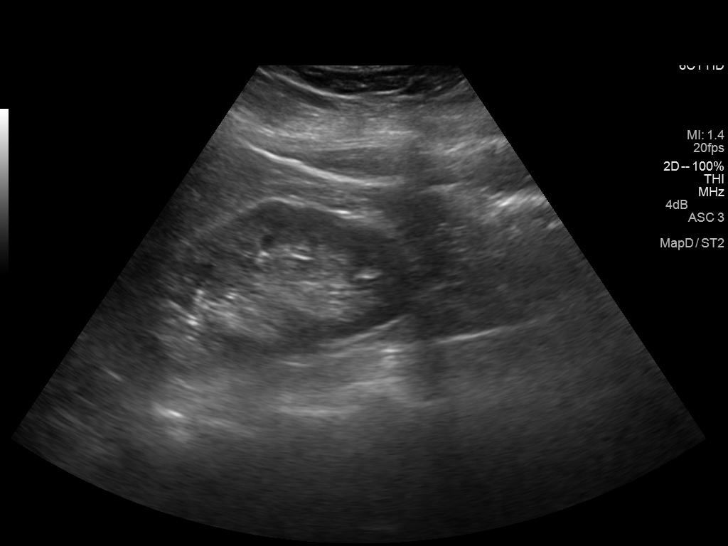
[im 23/35]
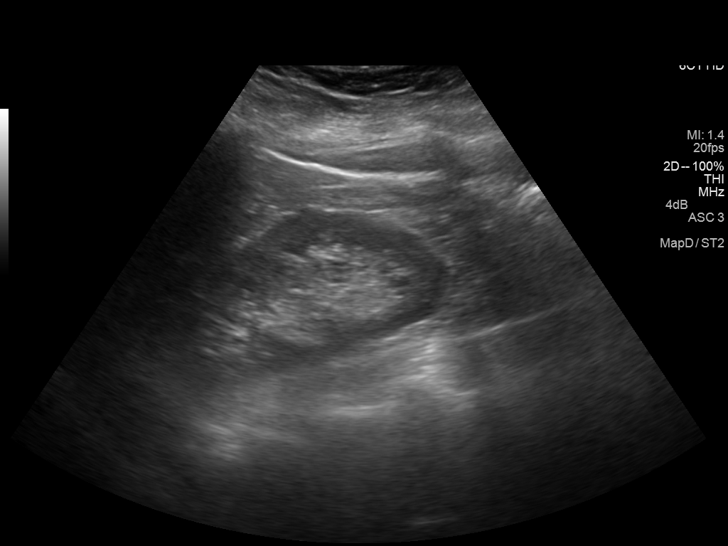
[im 26/35]
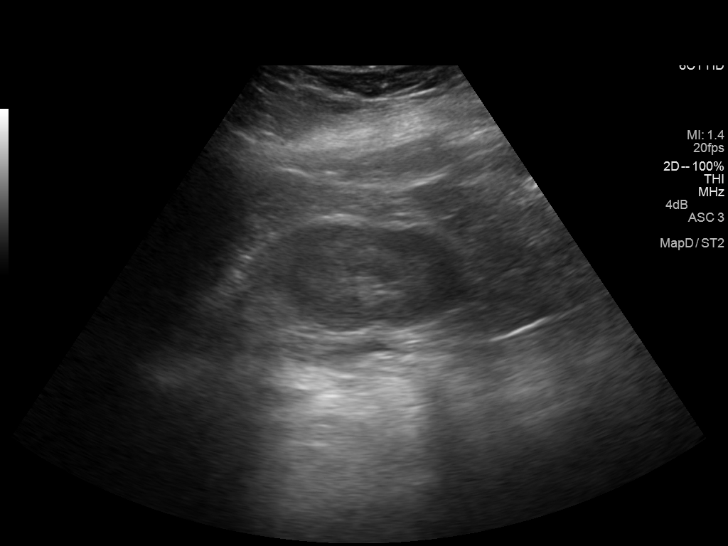
[im 29/35]
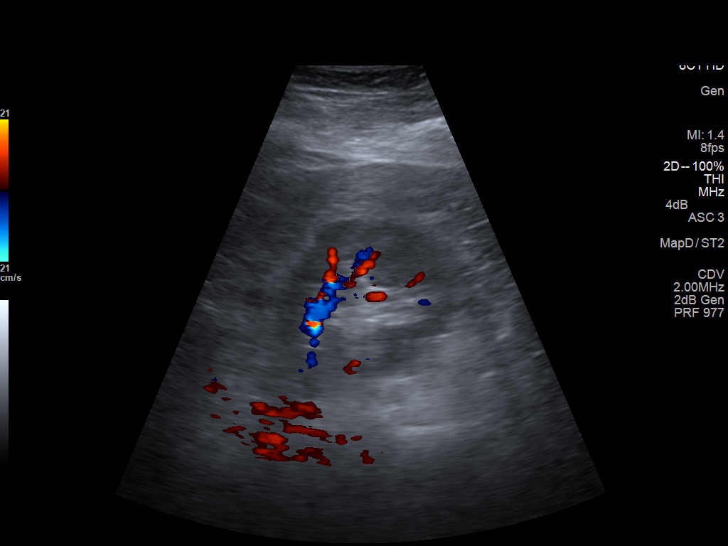
[im 32/35]
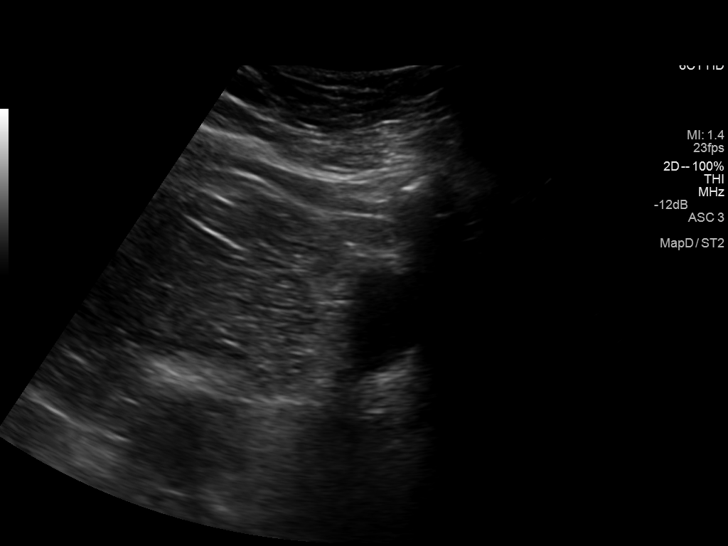
[im 35/35]
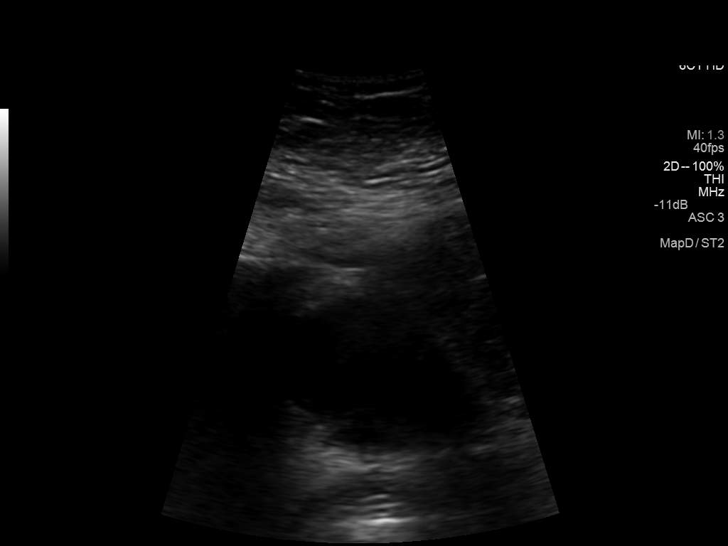

[14 of 25 positions shown; findings below may reference images not displayed]

FINDINGS: Right Kidney:

Length: 11 cm. Echogenicity within normal limits. No mass or
hydronephrosis visualized.

Left Kidney:

Length: 10.8 cm. Echogenicity within normal limits. No mass or
hydronephrosis visualized.

Bladder:

Appears normal for degree of bladder distention.
IMPRESSION: No acute abnormality noted.

## 2015-07-27 ENCOUNTER — Emergency Department: Payer: Medicaid Other

## 2015-07-27 ENCOUNTER — Emergency Department
Admission: EM | Admit: 2015-07-27 | Discharge: 2015-07-27 | Disposition: A | Discharge status: Home / Self Care | Payer: Medicaid Other | Attending: Emergency Medicine | Admitting: Emergency Medicine

## 2015-07-27 DIAGNOSIS — F17210 Nicotine dependence, cigarettes, uncomplicated: Secondary | ICD-10-CM | POA: Insufficient documentation

## 2015-07-27 DIAGNOSIS — J069 Acute upper respiratory infection, unspecified: Secondary | ICD-10-CM | POA: Insufficient documentation

## 2015-07-27 DIAGNOSIS — Z79899 Other long term (current) drug therapy: Secondary | ICD-10-CM | POA: Insufficient documentation

## 2015-07-27 MED ORDER — AMOXICILLIN 500 MG PO CAPS
ORAL_CAPSULE | ORAL | Status: AC
  Administered 2015-07-27: 500 mg
  Filled 2015-07-27: qty 1

## 2015-07-27 MED ORDER — ALBUTEROL SULFATE HFA 108 (90 BASE) MCG/ACT IN AERS: 2.0000 | INHALATION_SPRAY | Freq: Four times a day (QID) | RESPIRATORY_TRACT | Status: DC | PRN

## 2015-07-27 MED ORDER — BENZONATATE 100 MG PO CAPS
ORAL_CAPSULE | ORAL | Status: AC
  Filled 2015-07-27: qty 2

## 2015-07-27 MED ORDER — GUAIFENESIN 100 MG/5ML PO SOLN
ORAL | Status: AC
  Filled 2015-07-27: qty 10

## 2015-07-27 MED ORDER — AMOXICILLIN 875 MG PO TABS: 875.0000 mg | ORAL_TABLET | Freq: Two times a day (BID) | ORAL | Status: DC

## 2015-07-27 MED ORDER — AMOXICILLIN 500 MG PO CAPS: 500.0000 mg | ORAL_CAPSULE | Freq: Once | ORAL | Status: DC

## 2015-07-27 MED ORDER — GUAIFENESIN 100 MG/5ML PO SOLN: 10.0000 mL | Freq: Once | ORAL | Status: DC

## 2015-07-27 MED ORDER — GUAIFENESIN-CODEINE 100-10 MG/5ML PO SOLN: 10.0000 mL | ORAL | Status: DC | PRN

## 2015-07-27 MED ORDER — PREDNISONE 10 MG PO TABS: 50.0000 mg | ORAL_TABLET | Freq: Every day | ORAL | Status: DC

## 2015-07-27 MED ORDER — BENZONATATE 100 MG PO CAPS
200.0000 mg | ORAL_CAPSULE | Freq: Once | ORAL | Status: AC
  Administered 2015-07-27: 200 mg via ORAL

## 2015-07-27 MED ORDER — GUAIFENESIN-CODEINE 100-10 MG/5ML PO SOLN: 10.0000 mL | Freq: Once | ORAL | Status: DC

## 2015-07-27 MED ORDER — PSEUDOEPHEDRINE HCL 60 MG PO TABS: 60.0000 mg | ORAL_TABLET | ORAL | Status: DC | PRN

## 2015-07-27 MED ORDER — IPRATROPIUM-ALBUTEROL 0.5-2.5 (3) MG/3ML IN SOLN
3.0000 mL | Freq: Once | RESPIRATORY_TRACT | Status: AC
  Administered 2015-07-27: 3 mL via RESPIRATORY_TRACT
  Filled 2015-07-27: qty 3

## 2015-07-27 NOTE — Discharge Instructions (Signed)
Upper Respiratory Infection, Adult Most upper respiratory infections (URIs) are a viral infection of the air passages leading to the lungs. A URI affects the nose, throat, and upper air passages. The most common type of URI is nasopharyngitis and is typically referred to as "the common cold." URIs run their course and usually go away on their own. Most of the time, a URI does not require medical attention, but sometimes a bacterial infection in the upper airways can follow a viral infection. This is called a secondary infection. Sinus and middle ear infections are common types of secondary upper respiratory infections. Bacterial pneumonia can also complicate a URI. A URI can worsen asthma and chronic obstructive pulmonary disease (COPD). Sometimes, these complications can require emergency medical care and may be life threatening.  CAUSES Almost all URIs are caused by viruses. A virus is a type of germ and can spread from one person to another.  RISKS FACTORS You may be at risk for a URI if:   You smoke.   You have chronic heart or lung disease.  You have a weakened defense (immune) system.   You are very young or very old.   You have nasal allergies or asthma.  You work in crowded or poorly ventilated areas.  You work in health care facilities or schools. SIGNS AND SYMPTOMS  Symptoms typically develop 2-3 days after you come in contact with a cold virus. Most viral URIs last 7-10 days. However, viral URIs from the influenza virus (flu virus) can last 14-18 days and are typically more severe. Symptoms may include:   Runny or stuffy (congested) nose.   Sneezing.   Cough.   Sore throat.   Headache.   Fatigue.   Fever.   Loss of appetite.   Pain in your forehead, behind your eyes, and over your cheekbones (sinus pain).  Muscle aches.  DIAGNOSIS  Your health care provider may diagnose a URI by:  Physical exam.  Tests to check that your symptoms are not due to  another condition such as:  Strep throat.  Sinusitis.  Pneumonia.  Asthma. TREATMENT  A URI goes away on its own with time. It cannot be cured with medicines, but medicines may be prescribed or recommended to relieve symptoms. Medicines may help:  Reduce your fever.  Reduce your cough.  Relieve nasal congestion. HOME CARE INSTRUCTIONS   Take medicines only as directed by your health care provider.   Gargle warm saltwater or take cough drops to comfort your throat as directed by your health care provider.  Use a warm mist humidifier or inhale steam from a shower to increase air moisture. This may make it easier to breathe.  Drink enough fluid to keep your urine clear or pale yellow.   Eat soups and other clear broths and maintain good nutrition.   Rest as needed.   Return to work when your temperature has returned to normal or as your health care provider advises. You may need to stay home longer to avoid infecting others. You can also use a face mask and careful hand washing to prevent spread of the virus.  Increase the usage of your inhaler if you have asthma.   Do not use any tobacco products, including cigarettes, chewing tobacco, or electronic cigarettes. If you need help quitting, ask your health care provider. PREVENTION  The best way to protect yourself from getting a cold is to practice good hygiene.   Avoid oral or hand contact with people with cold   symptoms.   Wash your hands often if contact occurs.  There is no clear evidence that vitamin C, vitamin E, echinacea, or exercise reduces the chance of developing a cold. However, it is always recommended to get plenty of rest, exercise, and practice good nutrition.  SEEK MEDICAL CARE IF:   You are getting worse rather than better.   Your symptoms are not controlled by medicine.   You have chills.  You have worsening shortness of breath.  You have brown or red mucus.  You have yellow or brown nasal  discharge.  You have pain in your face, especially when you bend forward.  You have a fever.  You have swollen neck glands.  You have pain while swallowing.  You have white areas in the back of your throat. SEEK IMMEDIATE MEDICAL CARE IF:   You have severe or persistent:  Headache.  Ear pain.  Sinus pain.  Chest pain.  You have chronic lung disease and any of the following:  Wheezing.  Prolonged cough.  Coughing up blood.  A change in your usual mucus.  You have a stiff neck.  You have changes in your:  Vision.  Hearing.  Thinking.  Mood. MAKE SURE YOU:   Understand these instructions.  Will watch your condition.  Will get help right away if you are not doing well or get worse.   This information is not intended to replace advice given to you by your health care provider. Make sure you discuss any questions you have with your health care provider.   Document Released: 01/17/2001 Document Revised: 12/08/2014 Document Reviewed: 10/29/2013 Elsevier Interactive Patient Education 2016 Else[employer] Inc[employer]  

## 2015-07-27 NOTE — ED Provider Notes (Signed)
[facility] Emergency Department Provider Note  ____________________________________________  Time seen: Approximately 9:27 PM  I have reviewed the triage vital signs and the nursing notes.   HISTORY  Chief Complaint Cough    HPI Grace Evans is a 39 y.o. female who presents for evaluation of left ear pain and sore throat cough congestion for last 2-3 days. Patient reports that she is wheezing when she coughs.   Past Medical History  Diagnosis Date  . GERD (gastroesophageal reflux disease)     There are no active problems to display for this patient.   Past Surgical History  Procedure Laterality Date  . Cholecystectomy      Current Outpatient Rx  Name  Route  Sig  Dispense  Refill  . albuterol (PROVENTIL HFA;VENTOLIN HFA) 108 (90 BASE) MCG/ACT inhaler   Inhalation   Inhale 2 puffs into the lungs every 6 (six) hours as needed for wheezing or shortness of breath.   1 Inhaler   2   . amoxicillin (AMOXIL) 875 MG tablet   Oral   Take 1 tablet (875 mg total) by mouth 2 (two) times daily.   20 tablet   0   . guaiFENesin-codeine 100-10 MG/5ML syrup   Oral   Take 10 mLs by mouth every 4 (four) hours as needed for cough.   180 mL   0   . ibuprofen (ADVIL,MOTRIN) 800 MG tablet   Oral   Take 1 tablet (800 mg total) by mouth every 8 (eight) hours as needed.   30 tablet   0   . potassium chloride (K-DUR) 10 MEQ tablet   Oral   Take 2 tablets (20 mEq total) by mouth 2 (two) times daily.   20 tablet   0   . predniSONE (DELTASONE) 10 MG tablet   Oral   Take 5 tablets (50 mg total) by mouth daily with breakfast.   25 tablet   0   . pseudoephedrine (SUDAFED) 60 MG tablet   Oral   Take 1 tablet (60 mg total) by mouth every 4 (four) hours as needed for congestion.   24 tablet   0     Allergies Review of patient's allergies indicates no known allergies.  No family history on file.  Social History Social History  Substance Use  Topics  . Smoking status: Current Every Day Smoker -- 1.00 packs/day    Types: Cigarettes  . Smokeless tobacco: None  . Alcohol Use: No    Review of Systems Constitutional: No fever/chills Eyes: No visual changes. ENT: No sore throat. As a for earache Cardiovascular: Denies chest pain. Respiratory: Denies shortness of breath. Positive for cough congestion Gastrointestinal: No abdominal pain.  No nausea, no vomiting.  No diarrhea.  No constipation. Genitourinary: Negative for dysuria. Musculoskeletal: Negative for back pain. Skin: Negative for rash. Neurological: Negative for headaches, focal weakness or numbness.  10-point ROS otherwise negative.  ____________________________________________   PHYSICAL EXAM:  VITAL SIGNS: ED Triage Vitals  Enc Vitals Group     BP 07/27/15 1945 116/73 mmHg     Pulse Rate 07/27/15 1945 94     Resp 07/27/15 1945 16     Temp 07/27/15 1945 98.5 F (36.9 C)     Temp Source 07/27/15 1945 Oral     SpO2 07/27/15 1945 96 %     Weight 07/27/15 1945 215 lb (97.523 kg)     Height 07/27/15 1945 5\' 6"  (1.676 m)     Head Cir --  Peak Flow --      Pain Score 07/27/15 1948 6     Pain Loc --      Pain Edu? --      Excl. in GC? --     Constitutional: Alert and oriented. Well appearing and in no acute distress. Eyes: Conjunctivae are normal. PERRL. EOMI. Head: Atraumatic. Nose: No congestion/rhinnorhea. Mouth/Throat: Mucous membranes are moist.  Oropharynx non-erythematous. Neck: No stridor.   Cardiovascular: Normal rate, regular rhythm. Grossly normal heart sounds.  Good peripheral circulation. Respiratory: Normal respiratory effort.  No retractions. Lungs with scattered wheezing and coarse breath sounds bilaterally. Gastrointestinal: Soft and nontender. No distention. No abdominal bruits. No CVA tenderness. Musculoskeletal: No lower extremity tenderness nor edema.  No joint effusions. Neurologic:  Normal speech and language. No gross focal  neurologic deficits are appreciated. No gait instability. Skin:  Skin is warm, dry and intact. No rash noted. Psychiatric: Mood and affect are normal. Speech and behavior are normal.  ____________________________________________   LABS (all labs ordered are listed, but only abnormal results are displayed)  Labs Reviewed - No data to display ____________________________________________    RADIOLOGY  No acute pulmonary findings. ____________________________________________   PROCEDURES  Procedure(s) performed: None  Critical Care performed: No  ____________________________________________   INITIAL IMPRESSION / ASSESSMENT AND PLAN / ED COURSE  Pertinent labs & imaging results that were available during my care of the patient were reviewed by me and considered in my medical decision making (see chart for details).  Acute URI. Rx given for Zithromax, Robitussin-AC, and chlorpheniramine for drainage. Patient follow-up with PCP or return to the ER with any worsening symptomology. Rx also given for epidural inhaler. ____________________________________________   FINAL CLINICAL IMPRESSION(S) / ED DIAGNOSES  Final diagnoses:  URI, acute      Evangeline Dakin, PA-C 07/27/15 2308  Phineas Semen, MD 07/28/15 [phone]

## 2015-07-27 NOTE — ED Notes (Signed)
Pt presents to ED with cough, runny nose, ear ache, states she is wheezing.

## 2015-07-27 NOTE — ED Notes (Signed)
Patient ambulated to Xray with steady gait

## 2015-09-23 ENCOUNTER — Emergency Department
Admission: EM | Admit: 2015-09-23 | Discharge: 2015-09-23 | Disposition: A | Discharge status: Home / Self Care | Payer: Medicaid Other | Attending: Emergency Medicine | Admitting: Emergency Medicine

## 2015-09-23 ENCOUNTER — Encounter: Payer: Self-pay | Admitting: Urgent Care

## 2015-09-23 DIAGNOSIS — S60561A Insect bite (nonvenomous) of right hand, initial encounter: Secondary | ICD-10-CM | POA: Insufficient documentation

## 2015-09-23 DIAGNOSIS — Z79899 Other long term (current) drug therapy: Secondary | ICD-10-CM | POA: Insufficient documentation

## 2015-09-23 DIAGNOSIS — Y998 Other external cause status: Secondary | ICD-10-CM | POA: Insufficient documentation

## 2015-09-23 DIAGNOSIS — Z7952 Long term (current) use of systemic steroids: Secondary | ICD-10-CM | POA: Insufficient documentation

## 2015-09-23 DIAGNOSIS — S60562A Insect bite (nonvenomous) of left hand, initial encounter: Secondary | ICD-10-CM | POA: Insufficient documentation

## 2015-09-23 DIAGNOSIS — F17210 Nicotine dependence, cigarettes, uncomplicated: Secondary | ICD-10-CM | POA: Insufficient documentation

## 2015-09-23 DIAGNOSIS — Y9389 Activity, other specified: Secondary | ICD-10-CM | POA: Insufficient documentation

## 2015-09-23 DIAGNOSIS — Z792 Long term (current) use of antibiotics: Secondary | ICD-10-CM | POA: Insufficient documentation

## 2015-09-23 DIAGNOSIS — S1086XA Insect bite of other specified part of neck, initial encounter: Secondary | ICD-10-CM | POA: Insufficient documentation

## 2015-09-23 DIAGNOSIS — W57XXXA Bitten or stung by nonvenomous insect and other nonvenomous arthropods, initial encounter: Secondary | ICD-10-CM | POA: Insufficient documentation

## 2015-09-23 DIAGNOSIS — Y9289 Other specified places as the place of occurrence of the external cause: Secondary | ICD-10-CM | POA: Insufficient documentation

## 2015-09-23 MED ORDER — DIPHENHYDRAMINE HCL 25 MG PO CAPS
ORAL_CAPSULE | ORAL | Status: AC
  Administered 2015-09-23: 50 mg via ORAL
  Filled 2015-09-23: qty 2

## 2015-09-23 MED ORDER — PREDNISONE 20 MG PO TABS
ORAL_TABLET | ORAL | Status: AC
  Administered 2015-09-23: 60 mg via ORAL
  Filled 2015-09-23: qty 3

## 2015-09-23 MED ORDER — PERMETHRIN 5 % EX CREA
TOPICAL_CREAM | Freq: Once | CUTANEOUS | Status: AC
  Administered 2015-09-23: 03:00:00 via TOPICAL
  Filled 2015-09-23: qty 60

## 2015-09-23 MED ORDER — DIPHENHYDRAMINE HCL 25 MG PO CAPS
50.0000 mg | ORAL_CAPSULE | Freq: Once | ORAL | Status: AC
  Administered 2015-09-23: 50 mg via ORAL

## 2015-09-23 MED ORDER — PREDNISONE 20 MG PO TABS
60.0000 mg | ORAL_TABLET | Freq: Once | ORAL | Status: AC
  Administered 2015-09-23: 60 mg via ORAL

## 2015-09-23 NOTE — ED Notes (Signed)
Patient presents with rash/hives to BUE and LEFT side of neck - worsening x 7 days.

## 2015-09-23 NOTE — ED Provider Notes (Signed)
[facility] Emergency Department Provider Note  ____________________________________________  Time seen: 2:30 AM  I have reviewed the triage vital signs and the nursing notes.   HISTORY  Chief Complaint Rash     HPI Grace Evans is a 40 y.o. female presents with bilateral lower extremity pruritic rash that extends to the neck 7 days. Patient denies any fever. Patient concern for possibility of bed bug bites.    Past Medical History  Diagnosis Date  . GERD (gastroesophageal reflux disease)     There are no active problems to display for this patient.   Past Surgical History  Procedure Laterality Date  . Cholecystectomy      Current Outpatient Rx  Name  Route  Sig  Dispense  Refill  . albuterol (PROVENTIL HFA;VENTOLIN HFA) 108 (90 BASE) MCG/ACT inhaler   Inhalation   Inhale 2 puffs into the lungs every 6 (six) hours as needed for wheezing or shortness of breath.   1 Inhaler   2   . amoxicillin (AMOXIL) 875 MG tablet   Oral   Take 1 tablet (875 mg total) by mouth 2 (two) times daily.   20 tablet   0   . guaiFENesin-codeine 100-10 MG/5ML syrup   Oral   Take 10 mLs by mouth every 4 (four) hours as needed for cough.   180 mL   0   . ibuprofen (ADVIL,MOTRIN) 800 MG tablet   Oral   Take 1 tablet (800 mg total) by mouth every 8 (eight) hours as needed.   30 tablet   0   . potassium chloride (K-DUR) 10 MEQ tablet   Oral   Take 2 tablets (20 mEq total) by mouth 2 (two) times daily.   20 tablet   0   . predniSONE (DELTASONE) 10 MG tablet   Oral   Take 5 tablets (50 mg total) by mouth daily with breakfast.   25 tablet   0   . pseudoephedrine (SUDAFED) 60 MG tablet   Oral   Take 1 tablet (60 mg total) by mouth every 4 (four) hours as needed for congestion.   24 tablet   0     Allergies No known drug allergies No family history on file.  Social History Social History  Substance Use Topics  . Smoking status: Current  Every Day Smoker -- 1.00 packs/day    Types: Cigarettes  . Smokeless tobacco: None  . Alcohol Use: No    Review of Systems  Constitutional: Negative for fever. Eyes: Negative for visual changes. ENT: Negative for sore throat. Cardiovascular: Negative for chest pain. Respiratory: Negative for shortness of breath. Gastrointestinal: Negative for abdominal pain, vomiting and diarrhea. Genitourinary: Negative for dysuria. Musculoskeletal: Negative for back pain. Skin: Positive for rash. Neurological: Negative for headaches, focal weakness or numbness.   10-point ROS otherwise negative.  ____________________________________________   PHYSICAL EXAM:  VITAL SIGNS: ED Triage Vitals  Enc Vitals Group     BP 09/23/15 0049 138/87 mmHg     Pulse Rate 09/23/15 0049 91     Resp 09/23/15 0049 16     Temp 09/23/15 0049 98.6 F (37 C)     Temp Source 09/23/15 0049 Oral     SpO2 09/23/15 0049 100 %     Weight 09/23/15 0049 216 lb (97.977 kg)     Height 09/23/15 0049  (1.676 m)     Head Cir --      Peak Flow --  Pain Score 09/23/15 0050 0     Pain Loc --      Pain Edu? --      Excl. in GC? --      Constitutional: Alert and oriented. Well appearing and in no distress. Eyes: Conjunctivae are normal. PERRL. Normal extraocular movements. ENT   Head: Normocephalic and atraumatic.   Nose: No congestion/rhinnorhea.   Mouth/Throat: Mucous membranes are moist.   Neck: No stridor. Hematological/Lymphatic/Immunilogical: No cervical lymphadenopathy. Cardiovascular: Normal rate, regular rhythm. Normal and symmetric distal pulses are present in all extremities. No murmurs, rubs, or gallops. Respiratory: Normal respiratory effort without tachypnea nor retractions. Breath sounds are clear and equal bilaterally. No wheezes/rales/rhonchi. Gastrointestinal: Soft and nontender. No distention. There is no CVA tenderness. Genitourinary: deferred Musculoskeletal: Nontender with  normal range of motion in all extremities. No joint effusions.  No lower extremity tenderness nor edema. Neurologic:  Normal speech and language. No gross focal neurologic deficits are appreciated. Speech is normal.  Skin: Distinct petechial rash with surrounding erythema rash noted bilateral upper extremities and left side of the neck Psychiatric: Mood and affect are normal. Speech and behavior are normal. Patient exhibits appropriate insight and judgment.      INITIAL IMPRESSION / ASSESSMENT AND PLAN / ED COURSE  Pertinent labs & imaging results that were available during my care of the patient were reviewed by me and considered in my medical decision making (see chart for details).  Concern for possible bedbug bites versus scabies  ____________________________________________   FINAL CLINICAL IMPRESSION(S) / ED DIAGNOSES  Final diagnoses:  Bed bug bite      Darci Current, MD 09/23/15 [phone]

## 2015-09-23 NOTE — ED Notes (Signed)
Brown,MD consulted. MD made aware of presenting complaints and triage assessment. MD with VORB for: Benadryl  PO and Prednisone  PO. Orders to be entered and carried by this RN.

## 2015-11-20 ENCOUNTER — Emergency Department
Admission: EM | Admit: 2015-11-20 | Discharge: 2015-11-20 | Disposition: A | Discharge status: Home / Self Care | Payer: Self-pay | Attending: Emergency Medicine | Admitting: Emergency Medicine

## 2015-11-20 ENCOUNTER — Encounter: Payer: Self-pay | Admitting: Emergency Medicine

## 2015-11-20 DIAGNOSIS — K219 Gastro-esophageal reflux disease without esophagitis: Secondary | ICD-10-CM | POA: Insufficient documentation

## 2015-11-20 DIAGNOSIS — Z7951 Long term (current) use of inhaled steroids: Secondary | ICD-10-CM | POA: Insufficient documentation

## 2015-11-20 DIAGNOSIS — J069 Acute upper respiratory infection, unspecified: Secondary | ICD-10-CM | POA: Insufficient documentation

## 2015-11-20 DIAGNOSIS — F17210 Nicotine dependence, cigarettes, uncomplicated: Secondary | ICD-10-CM | POA: Insufficient documentation

## 2015-11-20 LAB — POCT RAPID STREP A: Streptococcus, Group A Screen (Direct): NEGATIVE

## 2015-11-20 MED ORDER — AMOXICILLIN 500 MG PO TABS: 500.0000 mg | ORAL_TABLET | Freq: Three times a day (TID) | ORAL | Status: DC

## 2015-11-20 NOTE — Discharge Instructions (Signed)
Upper Respiratory Infection, Adult Most upper respiratory infections (URIs) are a viral infection of the air passages leading to the lungs. A URI affects the nose, throat, and upper air passages. The most common type of URI is nasopharyngitis and is typically referred to as "the common cold." URIs run their course and usually go away on their own. Most of the time, a URI does not require medical attention, but sometimes a bacterial infection in the upper airways can follow a viral infection. This is called a secondary infection. Sinus and middle ear infections are common types of secondary upper respiratory infections. Bacterial pneumonia can also complicate a URI. A URI can worsen asthma and chronic obstructive pulmonary disease (COPD). Sometimes, these complications can require emergency medical care and may be life threatening.  CAUSES Almost all URIs are caused by viruses. A virus is a type of germ and can spread from one person to another.  RISKS FACTORS You may be at risk for a URI if:   You smoke.   You have chronic heart or lung disease.  You have a weakened defense (immune) system.   You are very young or very old.   You have nasal allergies or asthma.  You work in crowded or poorly ventilated areas.  You work in health care facilities or schools. SIGNS AND SYMPTOMS  Symptoms typically develop 2-3 days after you come in contact with a cold virus. Most viral URIs last 7-10 days. However, viral URIs from the influenza virus (flu virus) can last 14-18 days and are typically more severe. Symptoms may include:   Runny or stuffy (congested) nose.   Sneezing.   Cough.   Sore throat.   Headache.   Fatigue.   Fever.   Loss of appetite.   Pain in your forehead, behind your eyes, and over your cheekbones (sinus pain).  Muscle aches.  DIAGNOSIS  Your health care provider may diagnose a URI by:  Physical exam.  Tests to check that your symptoms are not due to  another condition such as:  Strep throat.  Sinusitis.  Pneumonia.  Asthma. TREATMENT  A URI goes away on its own with time. It cannot be cured with medicines, but medicines may be prescribed or recommended to relieve symptoms. Medicines may help:  Reduce your fever.  Reduce your cough.  Relieve nasal congestion. HOME CARE INSTRUCTIONS   Take medicines only as directed by your health care provider.   Gargle warm saltwater or take cough drops to comfort your throat as directed by your health care provider.  Use a warm mist humidifier or inhale steam from a shower to increase air moisture. This may make it easier to breathe.  Drink enough fluid to keep your urine clear or pale yellow.   Eat soups and other clear broths and maintain good nutrition.   Rest as needed.   Return to work when your temperature has returned to normal or as your health care provider advises. You may need to stay home longer to avoid infecting others. You can also use a face mask and careful hand washing to prevent spread of the virus.  Increase the usage of your inhaler if you have asthma.   Do not use any tobacco products, including cigarettes, chewing tobacco, or electronic cigarettes. If you need help quitting, ask your health care provider. PREVENTION  The best way to protect yourself from getting a cold is to practice good hygiene.   Avoid oral or hand contact with people with cold  symptoms.   Wash your hands often if contact occurs.  There is no clear evidence that vitamin C, vitamin E, echinacea, or exercise reduces the chance of developing a cold. However, it is always recommended to get plenty of rest, exercise, and practice good nutrition.  SEEK MEDICAL CARE IF:   You are getting worse rather than better.   Your symptoms are not controlled by medicine.   You have chills.  You have worsening shortness of breath.  You have brown or red mucus.  You have yellow or brown nasal  discharge.  You have pain in your face, especially when you bend forward.  You have a fever.  You have swollen neck glands.  You have pain while swallowing.  You have white areas in the back of your throat. SEEK IMMEDIATE MEDICAL CARE IF:   You have severe or persistent:  Headache.  Ear pain.  Sinus pain.  Chest pain.  You have chronic lung disease and any of the following:  Wheezing.  Prolonged cough.  Coughing up blood.  A change in your usual mucus.  You have a stiff neck.  You have changes in your:  Vision.  Hearing.  Thinking.  Mood. MAKE SURE YOU:   Understand these instructions.  Will watch your condition.  Will get help right away if you are not doing well or get worse.   This information is not intended to replace advice given to you by your health care provider. Make sure you discuss any questions you have with your health care provider.   Document Released: 01/17/2001 Document Revised: 12/08/2014 Document Reviewed: 10/29/2013 Elsevier Interactive Patient Education 2016 [employer]   You have a respiratory infection that should likely improve over the next 2-3 days. If worsening, take antibiotic as directed. Return to emergency room for worsening symptoms.

## 2015-11-20 NOTE — ED Provider Notes (Signed)
[facility] Emergency Department Provider Note  ____________________________________________  Time seen: Approximately 3:23 PM  I have reviewed the triage vital signs and the nursing notes.   HISTORY  Chief Complaint Sore Throat    HPI Grace Evans is a 40 y.o. female who presents with several days of sore throat with pain into the left anterior neck. His having cough and congestion as well. No nausea or vomiting. No fevers. No myalgias. Her mother with similar symptoms.   Past Medical History  Diagnosis Date  . GERD (gastroesophageal reflux disease)     There are no active problems to display for this patient.   Past Surgical History  Procedure Laterality Date  . Cholecystectomy      Current Outpatient Rx  Name  Route  Sig  Dispense  Refill  . albuterol (PROVENTIL HFA;VENTOLIN HFA) 108 (90 BASE) MCG/ACT inhaler   Inhalation   Inhale 2 puffs into the lungs every 6 (six) hours as needed for wheezing or shortness of breath.   1 Inhaler   2   . amoxicillin (AMOXIL) 500 MG tablet   Oral   Take 1 tablet (500 mg total) by mouth 3 (three) times daily.   30 tablet   0   . amoxicillin (AMOXIL) 875 MG tablet   Oral   Take 1 tablet (875 mg total) by mouth 2 (two) times daily.   20 tablet   0   . guaiFENesin-codeine 100-10 MG/5ML syrup   Oral   Take 10 mLs by mouth every 4 (four) hours as needed for cough.   180 mL   0   . ibuprofen (ADVIL,MOTRIN) 800 MG tablet   Oral   Take 1 tablet (800 mg total) by mouth every 8 (eight) hours as needed.   30 tablet   0   . potassium chloride (K-DUR) 10 MEQ tablet   Oral   Take 2 tablets (20 mEq total) by mouth 2 (two) times daily.   20 tablet   0   . predniSONE (DELTASONE) 10 MG tablet   Oral   Take 5 tablets (50 mg total) by mouth daily with breakfast.   25 tablet   0   . pseudoephedrine (SUDAFED) 60 MG tablet   Oral   Take 1 tablet (60 mg total) by mouth every 4 (four) hours as needed  for congestion.   24 tablet   0     Allergies Review of patient's allergies indicates no known allergies.  No family history on file.  Social History Social History  Substance Use Topics  . Smoking status: Current Every Day Smoker -- 1.00 packs/day    Types: Cigarettes  . Smokeless tobacco: None  . Alcohol Use: No    Review of Systems Constitutional: No fever/chills Eyes: No visual changes. ENT: sore throat, no ear pain. Cardiovascular: Denies chest pain. Respiratory: Denies shortness of breath. Gastrointestinal: No abdominal pain.  No nausea, no vomiting.  No diarrhea.  No constipation. Genitourinary: Negative for dysuria. Musculoskeletal: Negative for back pain. Skin: Negative for rash. Neurological: Negative for headaches, focal weakness or numbness. 10-point ROS otherwise negative.  ____________________________________________   PHYSICAL EXAM:  VITAL SIGNS: ED Triage Vitals  Enc Vitals Group     BP 11/20/15 1407 118/80 mmHg     Pulse Rate 11/20/15 1407 94     Resp 11/20/15 1407 20     Temp 11/20/15 1407 98.1 F (36.7 C)     Temp Source 11/20/15 1407 Oral  SpO2 11/20/15 1407 96 %     Weight 11/20/15 1407 218 lb (98.884 kg)     Height 11/20/15 1407 5\' 4"  (1.626 m)     Head Cir --      Peak Flow --      Pain Score 11/20/15 1408 6     Pain Loc --      Pain Edu? --      Excl. in GC? --     Constitutional: Alert and oriented. Well appearing and in no acute distress. Eyes: Conjunctivae are normal. PERRL. EOMI. Ears:  Clear with normal landmarks. No erythema. Head: Atraumatic. Nose: No congestion/rhinnorhea. Mouth/Throat: Mucous membranes are moist.  Oropharynx non-erythematous. No lesions. Neck:  Supple.  No adenopathy, but has tenderness in the anterior cervical chain. No mass or redness noted. Cardiovascular: Normal rate, regular rhythm. Grossly normal heart sounds.  Good peripheral circulation. Respiratory: Normal respiratory effort.  No  retractions. Lungs CTAB. Gastrointestinal: Soft and nontender. No distention. No abdominal bruits. No CVA tenderness. Neurologic:  Normal speech and language. No gross focal neurologic deficits are appreciated. No gait instability. Skin:  Skin is warm, dry and intact. No rash noted. Psychiatric: Mood and affect are normal. Speech and behavior are normal.  ____________________________________________   LABS (all labs ordered are listed, but only abnormal results are displayed)  Labs Reviewed  POCT RAPID STREP A   ____________________________________________  EKG   ____________________________________________  RADIOLOGY   ____________________________________________   PROCEDURES  Procedure(s) performed: None  Critical Care performed: No  ____________________________________________   INITIAL IMPRESSION / ASSESSMENT AND PLAN / ED COURSE  Pertinent labs & imaging results that were available during my care of the patient were reviewed by me and considered in my medical decision making (see chart for details).  40 year old with 2-3 days of sore throat and soreness in the left anterior neck. She is also had other URI symptoms such as cough and congestion, headache. No myalgias. She has a negative rapid strep. Suspect URI, likely viral. However if worsens she may take amoxicillin. Instructed in usual course of viral URIs.  ________________________________________   FINAL CLINICAL IMPRESSION(S) / ED DIAGNOSES  Final diagnoses:  URI, acute      Ignacia BayleyRobert Zakarie Sturdivant, PA-C 11/20/15 1526  Governor Rooksebecca Lord, MD 11/20/15 [phone]

## 2015-11-20 NOTE — ED Notes (Signed)
Sore throat x 2 days

## 2015-11-20 NOTE — ED Notes (Signed)
Discussed discharge instructions, prescriptions, and follow-up care with patient. No questions or concerns at this time. Pt stable at discharge.  

## 2016-06-29 ENCOUNTER — Encounter: Payer: Self-pay | Admitting: *Deleted

## 2016-06-29 DIAGNOSIS — Z79899 Other long term (current) drug therapy: Secondary | ICD-10-CM | POA: Insufficient documentation

## 2016-06-29 DIAGNOSIS — H66002 Acute suppurative otitis media without spontaneous rupture of ear drum, left ear: Secondary | ICD-10-CM | POA: Insufficient documentation

## 2016-06-29 DIAGNOSIS — F17210 Nicotine dependence, cigarettes, uncomplicated: Secondary | ICD-10-CM | POA: Insufficient documentation

## 2016-06-29 DIAGNOSIS — J029 Acute pharyngitis, unspecified: Secondary | ICD-10-CM | POA: Insufficient documentation

## 2016-06-30 ENCOUNTER — Emergency Department
Admission: EM | Admit: 2016-06-30 | Discharge: 2016-06-30 | Disposition: A | Discharge status: Home / Self Care | Payer: Medicaid Other | Attending: Emergency Medicine | Admitting: Emergency Medicine

## 2016-06-30 DIAGNOSIS — J029 Acute pharyngitis, unspecified: Secondary | ICD-10-CM

## 2016-06-30 DIAGNOSIS — H9202 Otalgia, left ear: Secondary | ICD-10-CM

## 2016-06-30 DIAGNOSIS — H66002 Acute suppurative otitis media without spontaneous rupture of ear drum, left ear: Secondary | ICD-10-CM

## 2016-06-30 MED ORDER — MAGIC MOUTHWASH: 10.0000 mL | Freq: Three times a day (TID) | ORAL | 0 refills | Status: DC | PRN

## 2016-06-30 MED ORDER — AMOXICILLIN 500 MG PO CAPS: 500.0000 mg | ORAL_CAPSULE | Freq: Three times a day (TID) | ORAL | 0 refills | Status: DC

## 2016-06-30 MED ORDER — MAGIC MOUTHWASH
10.0000 mL | Freq: Once | ORAL | Status: AC
  Administered 2016-06-30: 10 mL via ORAL
  Filled 2016-06-30: qty 10

## 2016-06-30 MED ORDER — AMOXICILLIN 500 MG PO CAPS
500.0000 mg | ORAL_CAPSULE | Freq: Once | ORAL | Status: AC
  Administered 2016-06-30: 500 mg via ORAL
  Filled 2016-06-30: qty 1

## 2016-06-30 MED ORDER — LIDOCAINE VISCOUS 2 % MT SOLN
15.0000 mL | Freq: Once | OROMUCOSAL | Status: AC
  Administered 2016-06-30: 15 mL via OROMUCOSAL
  Filled 2016-06-30: qty 15

## 2016-06-30 NOTE — ED Notes (Signed)

## 2016-06-30 NOTE — Discharge Instructions (Signed)
1. Take antibiotic as prescribed (amoxicillin 500 mg 3 times daily 7 days). 2. You may use Magic mouthwash as needed for throat discomfort. 3. Return to the ER for worsening symptoms, persistent vomiting, fever, difficulty breathing or other concerns.

## 2016-06-30 NOTE — ED Notes (Signed)
Pt. Reports aching pain started about 2 days ago, from right ear to jaw and throat on the right side. Denies drainage, hurts to swallow, denies fever.

## 2016-06-30 NOTE — ED Provider Notes (Signed)
[facility] Emergency Department Provider Note   ____________________________________________   First MD Initiated Contact with Patient 06/30/16 [phone]     (approximate)  I have reviewed the triage vital signs and the nursing notes.   HISTORY  Chief Complaint Otalgia    HPI Grace Evans is a 40 y.o. female who presents to the ED from home with a chief complaint of left earache and sore throat.Patient reports onset of symptoms about 2 days ago. Denies associated fever, chills, chest pain, shortness of breath, abdominal pain, nausea, vomiting, diarrhea. Denies recent travel trauma. Nothing makes her symptoms better or worse.   Past Medical History:  Diagnosis Date  . GERD (gastroesophageal reflux disease)     There are no active problems to display for this patient.   Past Surgical History:  Procedure Laterality Date  . CHOLECYSTECTOMY      Prior to Admission medications   Medication Sig Start Date End Date Taking? Authorizing Provider  albuterol (PROVENTIL HFA;VENTOLIN HFA) 108 (90 BASE) MCG/ACT inhaler Inhale 2 puffs into the lungs every 6 (six) hours as needed for wheezing or shortness of breath. 07/27/15   Charmayne Sheerharles M Beers, PA-C  amoxicillin (AMOXIL) 500 MG capsule Take 1 capsule (500 mg total) by mouth 3 (three) times daily. 06/30/16   Irean HongJade J Damarion Mendizabal, MD  guaiFENesin-codeine 100-10 MG/5ML syrup Take 10 mLs by mouth every 4 (four) hours as needed for cough. 07/27/15   Charmayne Sheerharles M Beers, PA-C  ibuprofen (ADVIL,MOTRIN) 800 MG tablet Take 1 tablet (800 mg total) by mouth every 8 (eight) hours as needed. 07/06/15   Evangeline Dakinharles M Beers, PA-C  magic mouthwash SOLN Take 10 mLs by mouth 3 (three) times daily as needed for mouth pain. 06/30/16   Irean HongJade J Mailin Coglianese, MD  potassium chloride (K-DUR) 10 MEQ tablet Take 2 tablets (20 mEq total) by mouth 2 (two) times daily. 01/17/15   Lyndal Pulleyaniel Knott, MD  predniSONE (DELTASONE) 10 MG tablet Take 5 tablets (50 mg total) by mouth  daily with breakfast. 07/27/15   Evangeline Dakinharles M Beers, PA-C  pseudoephedrine (SUDAFED) 60 MG tablet Take 1 tablet (60 mg total) by mouth every 4 (four) hours as needed for congestion. 07/27/15   Evangeline Dakinharles M Beers, PA-C    Allergies Patient has no known allergies.  No family history on file.  Social History Social History  Substance Use Topics  . Smoking status: Current Every Day Smoker    Packs/day: 1.00    Types: Cigarettes  . Smokeless tobacco: Never Used  . Alcohol use No    Review of Systems  Constitutional: No fever/chills. Eyes: No visual changes. ENT: Positive for left earache and sore throat. Cardiovascular: Denies chest pain. Respiratory: Denies shortness of breath. Gastrointestinal: No abdominal pain.  No nausea, no vomiting.  No diarrhea.  No constipation. Genitourinary: Negative for dysuria. Musculoskeletal: Negative for back pain. Skin: Negative for rash. Neurological: Negative for headaches, focal weakness or numbness.  10-point ROS otherwise negative.  ____________________________________________   PHYSICAL EXAM:  VITAL SIGNS: ED Triage Vitals  Enc Vitals Group     BP 06/29/16 2312 120/76     Pulse Rate 06/29/16 2312 84     Resp --      Temp 06/29/16 2312 98 F (36.7 C)     Temp Source 06/29/16 2312 Oral     SpO2 06/29/16 2312 100 %     Weight 06/29/16 2308 215 lb (97.5 kg)     Height 06/29/16 2308 5\' 4"  (1.626 m)  Head Circumference --      Peak Flow --      Pain Score 06/29/16 2308 5     Pain Loc --      Pain Edu? --      Excl. in GC? --     Constitutional: Alert and oriented. Well appearing and in mild acute distress. Eyes: Conjunctivae are normal. PERRL. EOMI. Head: Atraumatic. Ears: Right TM within normal limits. Left TM erythematous but intact. Nose: No congestion/rhinnorhea. Mouth/Throat: Mucous membranes are moist.  Oropharynx erythematous without tonsillar swelling, exudates or peritonsillar abscess. There is no hoarse or muffled  voice. There is no drooling. Neck: No stridor.   Hematological/Lymphatic/Immunilogical: Shotty anterior cervical lymphadenopathy. Cardiovascular: Normal rate, regular rhythm. Grossly normal heart sounds.  Good peripheral circulation. Respiratory: Normal respiratory effort.  No retractions. Lungs CTAB. Gastrointestinal: Soft and nontender. No distention. No abdominal bruits. No CVA tenderness. Musculoskeletal: No lower extremity tenderness nor edema.  No joint effusions. Neurologic:  Normal speech and language. No gross focal neurologic deficits are appreciated. No gait instability. Skin:  Skin is warm, dry and intact. No rash noted. Psychiatric: Mood and affect are normal. Speech and behavior are normal.  ____________________________________________   LABS (all labs ordered are listed, but only abnormal results are displayed)  Labs Reviewed - No data to display ____________________________________________  EKG  None ____________________________________________  RADIOLOGY  None ____________________________________________   PROCEDURES  Procedure(s) performed: None  Procedures  Critical Care performed: No  ____________________________________________   INITIAL IMPRESSION / ASSESSMENT AND PLAN / ED COURSE  Pertinent labs & imaging results that were available during my care of the patient were reviewed by me and considered in my medical decision making (see chart for details).  40 year old female who presents with left otitis media and pharyngitis. We'll treat with amoxicillin, Magic mouthwash and patient will follow-up with her PCP next week. Strict return precautions given. Patient verbalizes understanding and agrees with plan of care.  Clinical Course      ____________________________________________   FINAL CLINICAL IMPRESSION(S) / ED DIAGNOSES  Final diagnoses:  Left ear pain  Acute suppurative otitis media of left ear without spontaneous rupture of  tympanic membrane, recurrence not specified  Pharyngitis, unspecified etiology  Sore throat      NEW MEDICATIONS STARTED DURING THIS VISIT:  New Prescriptions   AMOXICILLIN (AMOXIL) 500 MG CAPSULE    Take 1 capsule (500 mg total) by mouth 3 (three) times daily.   MAGIC MOUTHWASH SOLN    Take 10 mLs by mouth 3 (three) times daily as needed for mouth pain.     Note:  This document was prepared using Dragon voice recognition software and may include unintentional dictation errors.    Irean HongJade J Josedejesus Marcum, MD 06/30/16 [phone]

## 2016-06-30 NOTE — ED Notes (Signed)
Pt. Becomes tearful upon staff asking about condition. Pt. Remains without tears when staff not in room

## 2016-09-10 ENCOUNTER — Emergency Department
Admission: EM | Admit: 2016-09-10 | Discharge: 2016-09-10 | Disposition: A | Discharge status: Home / Self Care | Payer: Self-pay | Attending: Emergency Medicine | Admitting: Emergency Medicine

## 2016-09-10 ENCOUNTER — Emergency Department: Payer: Self-pay

## 2016-09-10 ENCOUNTER — Encounter: Payer: Self-pay | Admitting: Emergency Medicine

## 2016-09-10 DIAGNOSIS — R1032 Left lower quadrant pain: Secondary | ICD-10-CM | POA: Insufficient documentation

## 2016-09-10 DIAGNOSIS — F17210 Nicotine dependence, cigarettes, uncomplicated: Secondary | ICD-10-CM | POA: Insufficient documentation

## 2016-09-10 DIAGNOSIS — R109 Unspecified abdominal pain: Secondary | ICD-10-CM

## 2016-09-10 LAB — COMPREHENSIVE METABOLIC PANEL
ALT: 17 U/L (ref 14–54)
AST: 18 U/L (ref 15–41)
Albumin: 4 g/dL (ref 3.5–5.0)
Alkaline Phosphatase: 87 U/L (ref 38–126)
Anion gap: 7 (ref 5–15)
BUN: 6 mg/dL (ref 6–20)
CHLORIDE: 105 mmol/L (ref 101–111)
CO2: 28 mmol/L (ref 22–32)
CREATININE: 0.69 mg/dL (ref 0.44–1.00)
Calcium: 8.7 mg/dL — ABNORMAL LOW (ref 8.9–10.3)
Glucose, Bld: 114 mg/dL — ABNORMAL HIGH (ref 65–99)
Potassium: 3.6 mmol/L (ref 3.5–5.1)
Sodium: 140 mmol/L (ref 135–145)
Total Bilirubin: 0.6 mg/dL (ref 0.3–1.2)
Total Protein: 7.1 g/dL (ref 6.5–8.1)

## 2016-09-10 LAB — URINALYSIS, COMPLETE (UACMP) WITH MICROSCOPIC
BILIRUBIN URINE: NEGATIVE
Bacteria, UA: NONE SEEN
Glucose, UA: NEGATIVE mg/dL
Hgb urine dipstick: NEGATIVE
Ketones, ur: 5 mg/dL — AB
LEUKOCYTES UA: NEGATIVE
NITRITE: NEGATIVE
PH: 5 (ref 5.0–8.0)
Protein, ur: 30 mg/dL — AB
SPECIFIC GRAVITY, URINE: 1.026 (ref 1.005–1.030)

## 2016-09-10 LAB — CBC
HCT: 44.2 % (ref 35.0–47.0)
Hemoglobin: 14.7 g/dL (ref 12.0–16.0)
MCH: 29.5 pg (ref 26.0–34.0)
MCHC: 33.4 g/dL (ref 32.0–36.0)
MCV: 88.5 fL (ref 80.0–100.0)
PLATELETS: 299 10*3/uL (ref 150–440)
RBC: 5 MIL/uL (ref 3.80–5.20)
RDW: 14.3 % (ref 11.5–14.5)
WBC: 13.1 10*3/uL — ABNORMAL HIGH (ref 3.6–11.0)

## 2016-09-10 LAB — LIPASE, BLOOD: LIPASE: 22 U/L (ref 11–51)

## 2016-09-10 MED ORDER — HYDROCODONE-ACETAMINOPHEN 5-325 MG PO TABS
2.0000 | ORAL_TABLET | Freq: Once | ORAL | Status: AC
  Administered 2016-09-10: 2 via ORAL
  Filled 2016-09-10: qty 2

## 2016-09-10 MED ORDER — CIPROFLOXACIN HCL 500 MG PO TABS: 500.0000 mg | ORAL_TABLET | Freq: Two times a day (BID) | ORAL | 0 refills | Status: AC

## 2016-09-10 MED ORDER — HYDROCODONE-ACETAMINOPHEN 5-325 MG PO TABS: 1.0000 | ORAL_TABLET | ORAL | 0 refills | Status: DC | PRN

## 2016-09-10 NOTE — Discharge Instructions (Signed)
Please take your antibiotic as prescribed, pain medication as needed, as written. Please follow-up your primary care doctor in the next 2 days for recheck/reevaluation. Return to the emergency department for any worsening pain, fever, or any other symptom personally concerning to yourself.

## 2016-09-10 NOTE — ED Triage Notes (Addendum)
Pt c/o low midline abd pain with urinary frequency since yesterday; pain radiates down into her vagina; denies vaginal discharge;  denies fever; pt adds she's had nausea for "years";

## 2016-09-10 NOTE — ED Provider Notes (Signed)
[facility] Emergency Department Provider Note  Time seen: 9:46 PM  I have reviewed the triage vital signs and the nursing notes.   HISTORY  Chief Complaint Urinary Frequency    HPI Grace Evans is a 41 y.o. female with a past medical history of gastric reflux who presents the emergency department with lower and left-sided abdominal discomfort. According to the patientsince yesterday she has been having lower abdominal pain and left-sided abdominal pain. States it has become moderate. Denies dysuria but states she is urinating frequently. States nausea but denies vomiting. Denies diarrhea. Denies vaginal discharge or bleeding.  Past Medical History:  Diagnosis Date  . GERD (gastroesophageal reflux disease)     There are no active problems to display for this patient.   Past Surgical History:  Procedure Laterality Date  . CHOLECYSTECTOMY      Prior to Admission medications   Not on File    No Known Allergies  History reviewed. No pertinent family history.  Social History Social History  Substance Use Topics  . Smoking status: Current Every Day Smoker    Packs/day: 1.00    Types: Cigarettes  . Smokeless tobacco: Never Used  . Alcohol use No    Review of Systems Constitutional: Negative for fever Cardiovascular: Negative for chest pain. Respiratory: Negative for shortness of breath. Gastrointestinal: Lower abdominal discomfort. Positive for nausea. Negative for vomiting or diarrhea. Genitourinary: Negative for dysuria. Positive for urinary frequency. Denies vaginal bleeding or discharge. Musculoskeletal: Negative for back pain. Neurological: Negative for headache 10-point ROS otherwise negative.  ____________________________________________   PHYSICAL EXAM:  VITAL SIGNS: ED Triage Vitals  Enc Vitals Group     BP 09/10/16 2114 (!) 133/92     Pulse Rate 09/10/16 2114 (!) 108     Resp 09/10/16 2114 18     Temp 09/10/16 2114 98.2  F (36.8 C)     Temp Source 09/10/16 2114 Oral     SpO2 09/10/16 2114 99 %     Weight 09/10/16 2115 215 lb (97.5 kg)     Height 09/10/16 2115 5\' 6"  (1.676 m)     Head Circumference --      Peak Flow --      Pain Score 09/10/16 2115 8     Pain Loc --      Pain Edu? --      Excl. in GC? --     Constitutional: Alert and oriented. Well appearing and in no distress. Eyes: Normal exam ENT   Head: Normocephalic and atraumatic   Mouth/Throat: Mucous membranes are moist. Cardiovascular: Normal rate, regular rhythm. No murmur Respiratory: Normal respiratory effort without tachypnea nor retractions. Breath sounds are clear  Gastrointestinal: Soft, mild suprapubic tenderness palpation, mild left lower quadrant tenderness. No rebound or guarding. No distention.  Musculoskeletal: Nontender with normal range of motion in all extremities.  Neurologic:  Normal speech and language. No gross focal neurologic deficits  Skin:  Skin is warm, dry and intact.  Psychiatric: Mood and affect are normal.   ____________________________________________     INITIAL IMPRESSION / ASSESSMENT AND PLAN / ED COURSE  Pertinent labs & imaging results that were available during my care of the patient were reviewed by me and considered in my medical decision making (see chart for details).  Patient presents the emergency department with lower abdominal discomfort since last night. States urinary frequency. Patient's urinalysis is normal. I have added on a urine culture. We'll check basic labs obtain a CT scan  and dose pain medication.  Patient's labs show slight leukocytosis of 13,000, CT scan is largely negative besides retroperitoneal infiltration which is nonspecific. Given the patient's urinary frequency with mild cytosis we will start the patient on antibiotics until urine culture has grown out. I discussed following up with her primary care doctor in 2 days for recheck. Patient is agreeable to this  plan.  ____________________________________________   FINAL CLINICAL IMPRESSION(S) / ED DIAGNOSES  Lower abdominal pain    Minna Antis, MD 09/10/16 2238

## 2016-09-12 LAB — URINE CULTURE

## 2016-11-30 ENCOUNTER — Encounter: Payer: Self-pay | Admitting: *Deleted

## 2016-11-30 ENCOUNTER — Emergency Department
Admission: EM | Admit: 2016-11-30 | Discharge: 2016-11-30 | Disposition: A | Discharge status: Home / Self Care | Payer: Self-pay | Attending: Student in an Organized Health Care Education/Training Program | Admitting: Student in an Organized Health Care Education/Training Program

## 2016-11-30 DIAGNOSIS — R51 Headache: Secondary | ICD-10-CM

## 2016-11-30 DIAGNOSIS — R519 Headache, unspecified: Secondary | ICD-10-CM

## 2016-11-30 DIAGNOSIS — R0981 Nasal congestion: Secondary | ICD-10-CM

## 2016-11-30 DIAGNOSIS — F17210 Nicotine dependence, cigarettes, uncomplicated: Secondary | ICD-10-CM | POA: Insufficient documentation

## 2016-11-30 DIAGNOSIS — J069 Acute upper respiratory infection, unspecified: Secondary | ICD-10-CM

## 2016-11-30 MED ORDER — DEXAMETHASONE 6 MG PO TABS
10.0000 mg | ORAL_TABLET | Freq: Once | ORAL | Status: DC
  Filled 2016-11-30: qty 1

## 2016-11-30 MED ORDER — AZITHROMYCIN 250 MG PO TABS: ORAL_TABLET | ORAL | 0 refills | Status: AC

## 2016-11-30 MED ORDER — ACETAMINOPHEN 500 MG PO TABS
1000.0000 mg | ORAL_TABLET | Freq: Once | ORAL | Status: AC
  Administered 2016-11-30: 1000 mg via ORAL

## 2016-11-30 MED ORDER — DEXAMETHASONE 4 MG PO TABS
10.0000 mg | ORAL_TABLET | Freq: Once | ORAL | Status: AC
  Administered 2016-11-30: 10 mg via ORAL
  Filled 2016-11-30: qty 2.5

## 2016-11-30 MED ORDER — BUTALBITAL-APAP-CAFFEINE 50-325-40 MG PO TABS: 1.0000 | ORAL_TABLET | Freq: Four times a day (QID) | ORAL | 0 refills | Status: AC | PRN

## 2016-11-30 MED ORDER — PROCHLORPERAZINE EDISYLATE 5 MG/ML IJ SOLN
10.0000 mg | Freq: Once | INTRAMUSCULAR | Status: AC
  Administered 2016-11-30: 10 mg via INTRAMUSCULAR
  Filled 2016-11-30: qty 2

## 2016-11-30 MED ORDER — ACETAMINOPHEN 500 MG PO TABS
ORAL_TABLET | ORAL | Status: AC
  Administered 2016-11-30: 1000 mg via ORAL
  Filled 2016-11-30: qty 2

## 2016-11-30 NOTE — ED Triage Notes (Signed)
Pt reports a headache since yesterday.  Pt taking tylenol without relief.  Pt also has nausea.  Pt alert  Speech clear.

## 2016-11-30 NOTE — ED Provider Notes (Signed)
[facility] Emergency Department Provider Note    First MD Initiated Contact with Patient 11/30/16 1651     (approximate)  I have reviewed the triage vital signs and the nursing notes.   HISTORY  Chief Complaint Headache    HPI Grace Evans is a 41 y.o. female presents with nasal congestion and right earache sore throat and frontal headache that has been going on since yesterday. Denies any numbness or tingling.  States that the headache onset was very gradual. No fevers. No neck stiffness. No blurry vision. As it had a nasal congested chills. States that she does smoke.   Past Medical History:  Diagnosis Date  . GERD (gastroesophageal reflux disease)    No family history on file. Past Surgical History:  Procedure Laterality Date  . CHOLECYSTECTOMY     There are no active problems to display for this patient.     Prior to Admission medications   Medication Sig Start Date End Date Taking? Authorizing Provider  azithromycin (ZITHROMAX Z-PAK) 250 MG tablet Take 2 tablets (500 mg) on  Day 1,  followed by 1 tablet (250 mg) once daily on Days 2 through 5. 11/30/16 12/05/16  Willy Eddy, MD  butalbital-acetaminophen-caffeine (FIORICET, [facility]) [phone] MG tablet Take 1-2 tablets by mouth every 6 (six) hours as needed for headache. 11/30/16 11/30/17  Willy Eddy, MD  HYDROcodone-acetaminophen (NORCO/VICODIN) 5-325 MG tablet Take 1 tablet by mouth every 4 (four) hours as needed. 09/10/16   Minna Antis, MD    Allergies Patient has no known allergies.    Social History Social History  Substance Use Topics  . Smoking status: Current Every Day Smoker    Packs/day: 1.00    Types: Cigarettes  . Smokeless tobacco: Never Used  . Alcohol use No    Review of Systems Patient denies headaches, rhinorrhea, blurry vision, numbness, shortness of breath, chest pain, edema, cough, abdominal pain, nausea, vomiting, diarrhea, dysuria, fevers, rashes  or hallucinations unless otherwise stated above in HPI. ____________________________________________   PHYSICAL EXAM:  VITAL SIGNS: Vitals:   11/30/16 1542  BP: 101/65  Pulse: 89  Resp: 18  Temp: 98.2 F (36.8 C)    Constitutional: Alert and oriented. Well appearing and in no acute distress. Eyes: Conjunctivae are normal. PERRL. EOMI. Head: Atraumatic.  ttp of maxillary sinuses Nose: + congestion/rhinnorhea. Mouth/Throat: Mucous membranes are moist.  Oropharynx non-erythematous. Neck: No stridor. Painless ROM. No cervical spine tenderness to palpation Hematological/Lymphatic/Immunilogical: No cervical lymphadenopathy. Cardiovascular: Normal rate, regular rhythm. Grossly normal heart sounds.  Good peripheral circulation. Respiratory: Normal respiratory effort.  No retractions. Lungs CTAB. Gastrointestinal: Soft and nontender. No distention. No abdominal bruits. No CVA tenderness. Musculoskeletal: No lower extremity tenderness nor edema.  No joint effusions. Neurologic:  Normal speech and language. No gross focal neurologic deficits are appreciated. No gait instability. Skin:  Skin is warm, dry and intact. No rash noted. Psychiatric: Mood and affect are normal. Speech and behavior are normal.  ____________________________________________   LABS (all labs ordered are listed, but only abnormal results are displayed)  No results found for this or any previous visit (from the past 24 hour(s)). ____________________________________________  EKG ____________________________________________  RADIOLOGY   ____________________________________________   PROCEDURES  Procedure(s) performed:  Procedures    Critical Care performed: no ____________________________________________   INITIAL IMPRESSION / ASSESSMENT AND PLAN / ED COURSE  Pertinent labs & imaging results that were available during my care of the patient were reviewed by me and considered in my medical  decision  making (see chart for details).  DDX: sinus headache, ich, migraine, tension  Grace Evans is a 41 y.o. who presents to the ED with p/w HA for last 2 days. Not worst HA ever. Gradual onset. HA similar to previous episodes. Denies focal neurologic symptoms. Denies trauma. No fevers or neck pain. No vision loss. Afebrile in ED. VSS. Exam as above. No meningeal signs. No CN, motor, sensory or cerebellar deficits. Temporal arteries palpable and non-tender. Appears well and non-toxic.  Likely tension, non-specific or possible migraine HA. Clinical picture is not consistent with ICH, SAH, SDH, EDH, TIA, or CVA. No concern for meningitis or encephalitis. No concern for GCA/Temporal arteritis.  Possible URI or sius headache.  Patient well appearing and in no distress.  Do not believe diagnostic testing clinically indicated with this well presentation.   ----------------------------------------- 6:12 PM on 11/30/2016 -----------------------------------------   Pain improved. Repeat neuro exam is again without focal deficit, nuchal rigidity or evidence of meningeal irritation.  Stable to D/C home, follow up with PCP or Neurology if persistent recurrent Has.  Have discussed with the patient and available family all diagnostics and treatments performed thus far and all questions were answered to the best of my ability. The patient demonstrates understanding and agreement with plan.        ____________________________________________   FINAL CLINICAL IMPRESSION(S) / ED DIAGNOSES  Final diagnoses:  Acute nonintractable headache, unspecified headache type  Nasal congestion  Upper respiratory tract infection, unspecified type      NEW MEDICATIONS STARTED DURING THIS VISIT:  New Prescriptions   AZITHROMYCIN (ZITHROMAX Z-PAK) 250 MG TABLET    Take 2 tablets (500 mg) on  Day 1,  followed by 1 tablet (250 mg) once daily on Days 2 through 5.   BUTALBITAL-ACETAMINOPHEN-CAFFEINE (FIORICET, ESGIC)  50-325-40 MG TABLET    Take 1-2 tablets by mouth every 6 (six) hours as needed for headache.     Note:  This document was prepared using Dragon voice recognition software and may include unintentional dictation errors.    Willy Eddy, MD 11/30/16 [phone]

## 2016-11-30 NOTE — Discharge Instructions (Signed)

## 2016-11-30 NOTE — ED Notes (Signed)
Pt states sinus congestion, headache and sore throat since yesterday, awake and alert in no acute distress, EDP at bedside

## 2016-11-30 NOTE — ED Notes (Signed)
Pt discharged home after verbalizing understanding of discharge instructions; nad noted. 

## 2017-03-16 ENCOUNTER — Emergency Department
Admission: EM | Admit: 2017-03-16 | Discharge: 2017-03-16 | Disposition: A | Discharge status: Home / Self Care | Payer: Self-pay | Attending: Emergency Medicine | Admitting: Emergency Medicine

## 2017-03-16 ENCOUNTER — Emergency Department: Payer: Self-pay

## 2017-03-16 DIAGNOSIS — Z79899 Other long term (current) drug therapy: Secondary | ICD-10-CM | POA: Insufficient documentation

## 2017-03-16 DIAGNOSIS — N12 Tubulo-interstitial nephritis, not specified as acute or chronic: Secondary | ICD-10-CM | POA: Insufficient documentation

## 2017-03-16 DIAGNOSIS — F17210 Nicotine dependence, cigarettes, uncomplicated: Secondary | ICD-10-CM | POA: Insufficient documentation

## 2017-03-16 DIAGNOSIS — R1030 Lower abdominal pain, unspecified: Secondary | ICD-10-CM | POA: Insufficient documentation

## 2017-03-16 LAB — URINALYSIS, ROUTINE W REFLEX MICROSCOPIC
BACTERIA UA: NONE SEEN
Glucose, UA: NEGATIVE mg/dL
Hgb urine dipstick: NEGATIVE
Ketones, ur: 5 mg/dL — AB
Leukocytes, UA: NEGATIVE
Nitrite: NEGATIVE
Protein, ur: 100 mg/dL — AB
pH: 5 (ref 5.0–8.0)

## 2017-03-16 MED ORDER — LEVOFLOXACIN 750 MG PO TABS: 750.0000 mg | ORAL_TABLET | Freq: Every day | ORAL | 0 refills | Status: AC

## 2017-03-16 MED ORDER — ACETAMINOPHEN 500 MG PO TABS
ORAL_TABLET | ORAL | Status: AC
  Filled 2017-03-16: qty 2

## 2017-03-16 MED ORDER — LEVOFLOXACIN 750 MG PO TABS
750.0000 mg | ORAL_TABLET | Freq: Once | ORAL | Status: AC
  Administered 2017-03-16: 750 mg via ORAL
  Filled 2017-03-16: qty 1

## 2017-03-16 MED ORDER — ACETAMINOPHEN 500 MG PO TABS
1000.0000 mg | ORAL_TABLET | Freq: Once | ORAL | Status: AC
  Administered 2017-03-16: 1000 mg via ORAL

## 2017-03-16 NOTE — ED Provider Notes (Signed)
[facility] Emergency Department Provider Note  ____________________________________________  Time seen: Approximately 7:30 PM  I have reviewed the triage vital signs and the nursing notes.   HISTORY  Chief Complaint Dysuria    HPI Grace Evans is a 41 y.o. female presenting to the emergency department with dysuria, increased urinary frequency and left-sided flank pain for the past 2 days. Patient states that she has a history of nephrolithiasis. Patient denies a history of pyelonephritis. No changes in vaginal discharge. Patient denies nausea, vomiting or abdominal pain. She has been afebrile and denies chills. Patient is not currently menstruating.   Past Medical History:  Diagnosis Date  . GERD (gastroesophageal reflux disease)     There are no active problems to display for this patient.   Past Surgical History:  Procedure Laterality Date  . CHOLECYSTECTOMY      Prior to Admission medications   Medication Sig Start Date End Date Taking? Authorizing Provider  butalbital-acetaminophen-caffeine (FIORICET, ESGIC) [phone] MG tablet Take 1-2 tablets by mouth every 6 (six) hours as needed for headache. 11/30/16 11/30/17  Willy Eddy, MD  HYDROcodone-acetaminophen (NORCO/VICODIN) 5-325 MG tablet Take 1 tablet by mouth every 4 (four) hours as needed. 09/10/16   Minna Antis, MD  levofloxacin (LEVAQUIN) 750 MG tablet Take 1 tablet (750 mg total) by mouth daily. 03/16/17 03/20/17  Orvil Feil, PA-C    Allergies Patient has no known allergies.  No family history on file.  Social History Social History  Substance Use Topics  . Smoking status: Current Every Day Smoker    Packs/day: 1.00    Types: Cigarettes  . Smokeless tobacco: Never Used  . Alcohol use No     Review of Systems  Constitutional: No fever/chills Cardiovascular: no chest pain. Respiratory: no cough. No SOB. Gastrointestinal: No abdominal pain.  No nausea, no  vomiting.  No diarrhea.  No constipation. Genitourinary: Patient has dysuria and increased urinary frequency.She has left-sided flank pain. Musculoskeletal: Negative for musculoskeletal pain. Skin: Negative for rash, abrasions, lacerations, ecchymosis. Neurological: Negative for headaches, focal weakness or numbness.   ____________________________________________   PHYSICAL EXAM:  VITAL SIGNS: ED Triage Vitals [03/16/17 1827]  Enc Vitals Group     BP 126/80     Pulse Rate 92     Resp 16     Temp 97.8 F (36.6 C)     Temp Source Oral     SpO2 96 %     Weight      Height      Head Circumference      Peak Flow      Pain Score 7     Pain Loc      Pain Edu?      Excl. in GC?      Constitutional: Alert and oriented. Well appearing and in no acute distress. Eyes: Conjunctivae are normal. PERRL. EOMI. Head: Atraumatic. Cardiovascular: Normal rate, regular rhythm. Normal S1 and S2.  Good peripheral circulation. Respiratory: Normal respiratory effort without tachypnea or retractions. Lungs CTAB. Good air entry to the bases with no decreased or absent breath sounds. Gastrointestinal: Bowel sounds 4 quadrants. Patient has suprapubic pain to light palpation. No guarding or rigidity. No palpable masses. No distention. Patient has left-sided CVA tenderness. Musculoskeletal: Full range of motion to all extremities. No gross deformities appreciated. Neurologic:  Normal speech and language. No gross focal neurologic deficits are appreciated.  Skin:  Skin is warm, dry and intact. No rash noted. Psychiatric: Mood and affect are  normal. Speech and behavior are normal. Patient exhibits appropriate insight and judgement.   ____________________________________________   LABS (all labs ordered are listed, but only abnormal results are displayed)  Labs Reviewed  URINALYSIS, ROUTINE W REFLEX MICROSCOPIC - Abnormal; Notable for the following:       Result Value   Color, Urine AMBER (*)     APPearance HAZY (*)    Specific Gravity, Urine >1.046 (*)    Bilirubin Urine MODERATE (*)    Ketones, ur 5 (*)    Protein, ur 100 (*)    Squamous Epithelial / LPF 6-30 (*)    All other components within normal limits  PREGNANCY, URINE   ____________________________________________  EKG   ____________________________________________  RADIOLOGY Geraldo Pitter, personally viewed and evaluated these images as part of my medical decision making, as well as reviewing the written report by the radiologist.    Ct Renal Stone Study  Result Date: 03/16/2017 CLINICAL DATA:  Lower abdomen and back pain. Urinary frequency and burning with urination. EXAM: CT ABDOMEN AND PELVIS WITHOUT CONTRAST TECHNIQUE: Multidetector CT imaging of the abdomen and pelvis was performed following the standard protocol without IV contrast. COMPARISON:  09/10/2016. FINDINGS: Lower chest: Clear lung bases. Hepatobiliary: Cholecystectomy clips.  Normal appearing liver. Pancreas: Unremarkable. No pancreatic ductal dilatation or surrounding inflammatory changes. Spleen: Small peripheral calcified granuloma. Normal in size and shape. Adrenals/Urinary Tract: Adrenal glands are unremarkable. Kidneys are normal, without renal calculi, focal lesion, or hydronephrosis. Bladder is unremarkable. Stomach/Bowel: Stomach is within normal limits. Appendix appears normal. No evidence of bowel wall thickening, distention, or inflammatory changes. Vascular/Lymphatic: No significant vascular findings are present. No enlarged abdominal or pelvic lymph nodes. Reproductive: Uterus and bilateral adnexa are unremarkable. Other: A 5.0 x 3.6 cm oval mass is again demonstrated in the subcutaneous fat of the left groin. This measured 4.5 x 3.9 cm on 09/10/2016 and 4.7 x 3.9 cm on 10/31/2014. Musculoskeletal: Lumbar and lower thoracic spine degenerative changes. IMPRESSION: 1. No acute abnormality. 2. No significant change in an oval, circumscribed mass  in the subcutaneous fat of the left groin, currently measuring 5.0 x 3.6 cm. This could represent a large sebaceous cyst or a benign neoplasm. Electronically Signed   By: Beckie Salts M.D.   On: 03/16/2017 21:38    ____________________________________________    PROCEDURES  Procedure(s) performed:    Procedures    Medications  acetaminophen (TYLENOL) tablet 1,000 mg (1,000 mg Oral Given 03/16/17 2205)  levofloxacin (LEVAQUIN) tablet 750 mg (750 mg Oral Given 03/16/17 2219)     ____________________________________________   INITIAL IMPRESSION / ASSESSMENT AND PLAN / ED COURSE  Pertinent labs & imaging results that were available during my care of the patient were reviewed by me and considered in my medical decision making (see chart for details).  Review of the [place] CSRS was performed in accordance of the [facility] prior to dispensing any controlled drugs.     Assessment and plan Pyelonephritis Patient presents to the emergency department with left sided CVA tenderness, dysuria and increased urinary frequency for the past 2 days. As patient has a history of nephrolithiasis, CT renal stone study was warranted. CT renal stone study did not indicate renal stones. With CVA tenderness on physical exam, I highly suspect early pyelonephritis. Patient was given Levaquin in the emergency department and discharged with Levaquin. Vital signs were reassuring prior to discharge. All patient questions were answered.  ____________________________________________  FINAL CLINICAL IMPRESSION(S) / ED DIAGNOSES  Final diagnoses:  Pyelonephritis      NEW MEDICATIONS STARTED DURING THIS VISIT:  Discharge Medication List as of 03/16/2017 10:11 PM    START taking these medications   Details  levofloxacin (LEVAQUIN) 750 MG tablet Take 1 tablet (750 mg total) by mouth daily., Starting Fri 03/16/2017, Until Tue 03/20/2017, Print            This chart was dictated using voice recognition  software/Dragon. Despite best efforts to proofread, errors can occur which can change the meaning. Any change was purely unintentional.    Orvil FeilWoods, Frannie Shedrick M, PA-C 03/16/17 2257    Sharman CheekStafford, Phillip, MD 03/19/17 2330

## 2017-03-16 NOTE — ED Notes (Signed)
Pt. Wants to see her mother who is being treated in this ED Room #13.

## 2017-03-16 NOTE — ED Triage Notes (Signed)
Pt presents to the ED for burning and frequency with urination, lower abdomen and back pain. Pt denies fever, chills, N/V.

## 2017-03-17 MED ORDER — CEPHALEXIN 500 MG PO CAPS
500.0000 mg | ORAL_CAPSULE | Freq: Four times a day (QID) | ORAL | 0 refills | Status: AC
Start: 2017-03-17 — End: 2017-03-27

## 2017-03-17 NOTE — ED Provider Notes (Signed)
The patient called the emergency department stating she was unable to fill her prescription for Levaquin because it was too expensive. On chart review it appears the patient might have a urinary tract infection that the provider yesterday felt was early pyelonephritis.  No urine culture was sent.  On review of her urinalysis is equivocal for infection although as the patient was symptomatic and think is reasonable to treat her with antibiotics regardless. I will prescribe her instead 10 days of Keflex.   Merrily Brittleifenbark, Laurelai Lepp, MD 03/17/17 [phone]

## 2017-09-15 ENCOUNTER — Emergency Department: Payer: Self-pay

## 2017-09-15 ENCOUNTER — Encounter: Payer: Self-pay | Admitting: Emergency Medicine

## 2017-09-15 ENCOUNTER — Emergency Department
Admission: EM | Admit: 2017-09-15 | Discharge: 2017-09-15 | Disposition: A | Discharge status: Home / Self Care | Payer: Self-pay | Attending: Emergency Medicine | Admitting: Emergency Medicine

## 2017-09-15 ENCOUNTER — Other Ambulatory Visit: Payer: Self-pay

## 2017-09-15 DIAGNOSIS — J209 Acute bronchitis, unspecified: Secondary | ICD-10-CM | POA: Insufficient documentation

## 2017-09-15 DIAGNOSIS — F17210 Nicotine dependence, cigarettes, uncomplicated: Secondary | ICD-10-CM | POA: Insufficient documentation

## 2017-09-15 DIAGNOSIS — B9789 Other viral agents as the cause of diseases classified elsewhere: Secondary | ICD-10-CM | POA: Insufficient documentation

## 2017-09-15 DIAGNOSIS — J069 Acute upper respiratory infection, unspecified: Secondary | ICD-10-CM | POA: Insufficient documentation

## 2017-09-15 MED ORDER — FLUTICASONE PROPIONATE 50 MCG/ACT NA SUSP: 1.0000 | Freq: Two times a day (BID) | NASAL | 0 refills | Status: DC

## 2017-09-15 MED ORDER — ACETAMINOPHEN 325 MG PO TABS
650.0000 mg | ORAL_TABLET | Freq: Once | ORAL | Status: AC
  Administered 2017-09-15: 650 mg via ORAL
  Filled 2017-09-15: qty 2

## 2017-09-15 MED ORDER — PREDNISONE 50 MG PO TABS: 50.0000 mg | ORAL_TABLET | Freq: Every day | ORAL | 0 refills | Status: DC

## 2017-09-15 MED ORDER — PSEUDOEPH-BROMPHEN-DM 30-2-10 MG/5ML PO SYRP: 10.0000 mL | ORAL_SOLUTION | Freq: Four times a day (QID) | ORAL | 0 refills | Status: DC | PRN

## 2017-09-15 MED ORDER — ALBUTEROL SULFATE HFA 108 (90 BASE) MCG/ACT IN AERS: 2.0000 | INHALATION_SPRAY | RESPIRATORY_TRACT | 0 refills | Status: DC | PRN

## 2017-09-15 MED ORDER — METHYLPREDNISOLONE SODIUM SUCC 125 MG IJ SOLR
125.0000 mg | Freq: Once | INTRAMUSCULAR | Status: AC
Start: 2017-09-15 — End: 2017-09-15
  Administered 2017-09-15: 125 mg via INTRAMUSCULAR
  Filled 2017-09-15: qty 2

## 2017-09-15 MED ORDER — ALBUTEROL SULFATE (2.5 MG/3ML) 0.083% IN NEBU
2.5000 mg | INHALATION_SOLUTION | Freq: Once | RESPIRATORY_TRACT | Status: AC
  Administered 2017-09-15: 2.5 mg via RESPIRATORY_TRACT
  Filled 2017-09-15: qty 3

## 2017-09-15 NOTE — ED Triage Notes (Signed)
Nasal congestion and post nasal drip x 2 days.

## 2017-09-15 NOTE — ED Provider Notes (Signed)
[facility] Emergency Department Provider Note  ____________________________________________  Time seen: Approximately 3:47 PM  I have reviewed the triage vital signs and the nursing notes.   HISTORY  Chief Complaint Nasal Congestion    HPI Grace Evans is a 42 y.o. female who presents the emergency department complaining of nasal congestion, sore throat, coughing, wheezing.  Patient has a long smoking history.  Patient denies any COPD, emphysema, bronchitis.  Patient reports his symptoms have been ongoing times 2 days.  She reports a nonproductive cough, but no shortness of breath or difficulty breathing.  Patient denies any headache, visual changes, chest pain, abdominal pain, nausea vomiting, diarrhea or constipation.  She has not tried any medications at home for this complaint.  No other complaints at this time.  Past Medical History:  Diagnosis Date  . GERD (gastroesophageal reflux disease)     There are no active problems to display for this patient.   Past Surgical History:  Procedure Laterality Date  . CHOLECYSTECTOMY      Prior to Admission medications   Medication Sig Start Date End Date Taking? Authorizing Provider  albuterol (PROVENTIL HFA;VENTOLIN HFA) 108 (90 Base) MCG/ACT inhaler Inhale 2 puffs into the lungs every 4 (four) hours as needed for wheezing or shortness of breath. 09/15/17   Cuthriell, Delorise Royals, PA-C  brompheniramine-pseudoephedrine-DM 30-2-10 MG/5ML syrup Take 10 mLs by mouth 4 (four) times daily as needed. 09/15/17   Cuthriell, Delorise Royals, PA-C  butalbital-acetaminophen-caffeine (FIORICET, ESGIC) [phone] MG tablet Take 1-2 tablets by mouth every 6 (six) hours as needed for headache. 11/30/16 11/30/17  Willy Eddy, MD  fluticasone [facility]) 50 MCG/ACT nasal spray Place 1 spray into both nostrils 2 (two) times daily. 09/15/17   Cuthriell, Delorise Royals, PA-C  HYDROcodone-acetaminophen (NORCO/VICODIN) 5-325 MG tablet Take 1  tablet by mouth every 4 (four) hours as needed. 09/10/16   Minna Antis, MD  predniSONE (DELTASONE) 50 MG tablet Take 1 tablet (50 mg total) by mouth daily with breakfast. 09/15/17   Cuthriell, Delorise Royals, PA-C  omeprazole (PRILOSEC) 20 MG capsule Take 1 capsule (20 mg total) by mouth daily. 01/17/15 07/27/15  Lyndal Pulley, MD    Allergies Patient has no known allergies.  No family history on file.  Social History Social History   Tobacco Use  . Smoking status: Current Every Day Smoker    Packs/day: 1.00    Types: Cigarettes  . Smokeless tobacco: Never Used  Substance Use Topics  . Alcohol use: No  . Drug use: No     Review of Systems  Constitutional: No fever/chills Eyes: No visual changes. No discharge ENT: Positive for nasal congestion and sore throat Cardiovascular: no chest pain. Respiratory: Positive cough. No SOB. Gastrointestinal: No abdominal pain.  No nausea, no vomiting.  No diarrhea.  No constipation. Musculoskeletal: Negative for musculoskeletal pain. Skin: Negative for rash, abrasions, lacerations, ecchymosis. Neurological: Negative for headaches, focal weakness or numbness. 10-point ROS otherwise negative.  ____________________________________________   PHYSICAL EXAM:  VITAL SIGNS: ED Triage Vitals  Enc Vitals Group     BP 09/15/17 1427 (!) 144/87     Pulse Rate 09/15/17 1427 94     Resp 09/15/17 1427 20     Temp 09/15/17 1427 98.9 F (37.2 C)     Temp Source 09/15/17 1427 Oral     SpO2 09/15/17 1427 100 %     Weight 09/15/17 1428 215 lb (97.5 kg)     Height 09/15/17 1428 5\' 6"  (1.676 m)  Head Circumference --      Peak Flow --      Pain Score 09/15/17 1428 4     Pain Loc --      Pain Edu? --      Excl. in GC? --      Constitutional: Alert and oriented. Well appearing and in no acute distress. Eyes: Conjunctivae are normal. PERRL. EOMI. Head: Atraumatic. ENT:      Ears: EACs and TMs unremarkable bilaterally.      Nose: Moderate  congestion/rhinnorhea.      Mouth/Throat: Mucous membranes are moist.  Oropharynx is mildly erythematous but nonedematous.  Uvula is midline Neck: No stridor.   Hematological/Lymphatic/Immunilogical: No cervical lymphadenopathy. Cardiovascular: Normal rate, regular rhythm. Normal S1 and S2.  Good peripheral circulation. Respiratory: Normal respiratory effort without tachypnea or retractions. Lungs with expiratory wheezing bilateral lung fields, crackles to the left lower lung field.  No rales or rhonchi.Peri [place] air entry to the bases with no decreased or absent breath sounds. Musculoskeletal: Full range of motion to all extremities. No gross deformities appreciated. Neurologic:  Normal speech and language. No gross focal neurologic deficits are appreciated.  Skin:  Skin is warm, dry and intact. No rash noted. Psychiatric: Mood and affect are normal. Speech and behavior are normal. Patient exhibits appropriate insight and judgement.   ____________________________________________   LABS (all labs ordered are listed, but only abnormal results are displayed)  Labs Reviewed - No data to display ____________________________________________  EKG   ____________________________________________  RADIOLOGY Festus Barren Cuthriell, personally viewed and evaluated these images (plain radiographs) as part of my medical decision making, as well as reviewing the written report by the radiologist.  Dg Chest 2 View  Result Date: 09/15/2017 CLINICAL DATA:  Cough and wheezing EXAM: CHEST  2 VIEW COMPARISON:  July 27, 2015 FINDINGS: Lungs are clear. Heart size and pulmonary vascularity are normal. No adenopathy. No bone lesions. IMPRESSION: No edema or consolidation. Electronically Signed   By: Bretta Bang III M.D.   On: 09/15/2017 16:08    ____________________________________________    PROCEDURES  Procedure(s) performed:    Procedures    Medications  acetaminophen (TYLENOL) tablet  650 mg (not administered)  albuterol (PROVENTIL) (2.5 MG/3ML) 0.083% nebulizer solution 2.5 mg (not administered)  methylPREDNISolone sodium succinate (SOLU-MEDROL) 125 mg/2 mL injection 125 mg (not administered)     ____________________________________________   INITIAL IMPRESSION / ASSESSMENT AND PLAN / ED COURSE  Pertinent labs & imaging results that were available during my care of the patient were reviewed by me and considered in my medical decision making (see chart for details).  Review of the [place] CSRS was performed in accordance of the [facility] prior to dispensing any controlled drugs.     Patient's diagnosis is consistent with viral URI with bronchitis.  Patient presents with 2-day history of nasal congestion, cough.  Exam is consistent with viral upper respiratory infection.  There was some wheezing x-ray reveals no consolidation consistent with pneumonia.  With 2-day history with no fevers, I suspect that this is viral bronchitis.  As such, patient will be discharged with prednisone, albuterol, cough medication, Flonase.  Tylenol and Motrin at home as needed.  Patient will follow primary care as needed..  Patient is given ED precautions to return to the ED for any worsening or new symptoms.     ____________________________________________  FINAL CLINICAL IMPRESSION(S) / ED DIAGNOSES  Final diagnoses:  Viral URI with cough  Acute bronchitis, unspecified organism  NEW MEDICATIONS STARTED DURING THIS VISIT:  ED Discharge Orders        Ordered    predniSONE (DELTASONE) 50 MG tablet  Daily with breakfast     09/15/17 1627    albuterol (PROVENTIL HFA;VENTOLIN HFA) 108 (90 Base) MCG/ACT inhaler  Every 4 hours PRN     09/15/17 1627    brompheniramine-pseudoephedrine-DM 30-2-10 MG/5ML syrup  4 times daily PRN     09/15/17 1627    fluticasone (FLONASE) 50 MCG/ACT nasal spray  2 times daily     09/15/17 1627          This chart was dictated using voice recognition  software/Dragon. Despite best efforts to proofread, errors can occur which can change the meaning. Any change was purely unintentional.    Racheal PatchesCuthriell, Jonathan D, PA-C 09/15/17 1630    Phineas SemenGoodman, Graydon, MD 09/15/17 [phone]

## 2017-10-01 ENCOUNTER — Emergency Department: Payer: Self-pay

## 2017-10-01 ENCOUNTER — Other Ambulatory Visit: Payer: Self-pay

## 2017-10-01 ENCOUNTER — Encounter: Payer: Self-pay | Admitting: Emergency Medicine

## 2017-10-01 ENCOUNTER — Emergency Department
Admission: EM | Admit: 2017-10-01 | Discharge: 2017-10-01 | Disposition: A | Discharge status: Home / Self Care | Payer: Self-pay | Attending: Emergency Medicine | Admitting: Emergency Medicine

## 2017-10-01 DIAGNOSIS — R05 Cough: Secondary | ICD-10-CM | POA: Insufficient documentation

## 2017-10-01 DIAGNOSIS — R0981 Nasal congestion: Secondary | ICD-10-CM | POA: Insufficient documentation

## 2017-10-01 DIAGNOSIS — B9789 Other viral agents as the cause of diseases classified elsewhere: Secondary | ICD-10-CM | POA: Insufficient documentation

## 2017-10-01 DIAGNOSIS — J069 Acute upper respiratory infection, unspecified: Secondary | ICD-10-CM | POA: Insufficient documentation

## 2017-10-01 DIAGNOSIS — Z79899 Other long term (current) drug therapy: Secondary | ICD-10-CM | POA: Insufficient documentation

## 2017-10-01 DIAGNOSIS — H9203 Otalgia, bilateral: Secondary | ICD-10-CM | POA: Insufficient documentation

## 2017-10-01 DIAGNOSIS — F17210 Nicotine dependence, cigarettes, uncomplicated: Secondary | ICD-10-CM | POA: Insufficient documentation

## 2017-10-01 LAB — GROUP A STREP BY PCR: Group A Strep by PCR: NOT DETECTED

## 2017-10-01 MED ORDER — PSEUDOEPH-BROMPHEN-DM 30-2-10 MG/5ML PO SYRP: 10.0000 mL | ORAL_SOLUTION | Freq: Four times a day (QID) | ORAL | 0 refills | Status: DC | PRN

## 2017-10-01 MED ORDER — MAGIC MOUTHWASH W/LIDOCAINE: 5.0000 mL | Freq: Four times a day (QID) | ORAL | 0 refills | Status: DC

## 2017-10-01 NOTE — ED Provider Notes (Signed)
[facility] Emergency Department Provider Note  ____________________________________________  Time seen: Approximately 9:38 PM  I have reviewed the triage vital signs and the nursing notes.   HISTORY  Chief Complaint Sore Throat; Cough; and Nasal Congestion    HPI Grace Evans is a 42 y.o. female who presents the emergency department complaining of nasal congestion, sore throat, cough, bilateral ear pain.  Patient reports that symptoms have been ongoing times 2 days.  No fevers or chills.  Patient denies any headache, neck pain or stiffness, chest pain, shortness of breath, abdominal pain, nausea or vomiting, diarrhea or constipation.  No medications at home for this complaint.  Patient is a heavy smoker.  No history of COPD or emphysema.  No other complaints at this time.  Past Medical History:  Diagnosis Date  . GERD (gastroesophageal reflux disease)     There are no active problems to display for this patient.   Past Surgical History:  Procedure Laterality Date  . CHOLECYSTECTOMY      Prior to Admission medications   Medication Sig Start Date End Date Taking? Authorizing Provider  albuterol (PROVENTIL HFA;VENTOLIN HFA) 108 (90 Base) MCG/ACT inhaler Inhale 2 puffs into the lungs every 4 (four) hours as needed for wheezing or shortness of breath. 09/15/17   Aleigha Gilani, Delorise Royals, PA-C  brompheniramine-pseudoephedrine-DM 30-2-10 MG/5ML syrup Take 10 mLs by mouth 4 (four) times daily as needed. 10/01/17   Gunner Iodice, Delorise Royals, PA-C  butalbital-acetaminophen-caffeine (FIORICET, ESGIC) [phone] MG tablet Take 1-2 tablets by mouth every 6 (six) hours as needed for headache. 11/30/16 11/30/17  Willy Eddy, MD  fluticasone [facility]) 50 MCG/ACT nasal spray Place 1 spray into both nostrils 2 (two) times daily. 09/15/17   Chrishawna Farina, Delorise Royals, PA-C  HYDROcodone-acetaminophen (NORCO/VICODIN) 5-325 MG tablet Take 1 tablet by mouth every 4 (four) hours as needed.  09/10/16   Minna Antis, MD  magic mouthwash w/lidocaine SOLN Take 5 mLs by mouth 4 (four) times daily. 10/01/17   Tosha Belgarde, Delorise Royals, PA-C  predniSONE (DELTASONE) 50 MG tablet Take 1 tablet (50 mg total) by mouth daily with breakfast. 09/15/17   Gertrude Tarbet, Delorise Royals, PA-C    Allergies Patient has no known allergies.  No family history on file.  Social History Social History   Tobacco Use  . Smoking status: Current Every Day Smoker    Packs/day: 1.00    Types: Cigarettes  . Smokeless tobacco: Never Used  Substance Use Topics  . Alcohol use: No  . Drug use: No     Review of Systems  Constitutional: No fever/chills Eyes: No visual changes. No discharge ENT: Positive for nasal congestion, bilateral ear pain, sore throat Cardiovascular: no chest pain. Respiratory: Positive cough. No SOB. Gastrointestinal: No abdominal pain.  No nausea, no vomiting.  No diarrhea.  No constipation. Musculoskeletal: Negative for musculoskeletal pain. Skin: Negative for rash, abrasions, lacerations, ecchymosis. Neurological: Negative for headaches, focal weakness or numbness. 10-point ROS otherwise negative.  ____________________________________________   PHYSICAL EXAM:  VITAL SIGNS: ED Triage Vitals  Enc Vitals Group     BP 10/01/17 2036 140/81     Pulse Rate 10/01/17 2036 97     Resp 10/01/17 2036 18     Temp 10/01/17 2036 98.2 F (36.8 C)     Temp Source 10/01/17 2036 Oral     SpO2 10/01/17 2036 98 %     Weight 10/01/17 2037 215 lb (97.5 kg)     Height 10/01/17 2037 5\' 6"  (1.676 m)  Head Circumference --      Peak Flow --      Pain Score 10/01/17 2037 6     Pain Loc --      Pain Edu? --      Excl. in GC? --      Constitutional: Alert and oriented. Well appearing and in no acute distress. Eyes: Conjunctivae are normal. PERRL. EOMI. Head: Atraumatic. ENT:      Ears: EACs and TMs unremarkable bilaterally.      Nose: Minimal clear congestion/rhinnorhea.       Mouth/Throat: Mucous membranes are moist.  Oropharynx is nonerythematous and nonedematous.  Uvula is midline. Neck: No stridor.  Neck is supple full range of motion Hematological/Lymphatic/Immunilogical: No cervical lymphadenopathy. Cardiovascular: Normal rate, regular rhythm. Normal S1 and S2.  Good peripheral circulation. Respiratory: Normal respiratory effort without tachypnea or retractions. Lungs with faint expiratory wheezes in the right lower lung field, otherwise no adventitious lung sounds appreciated.Peri Jefferson. Good air entry to the bases with no decreased or absent breath sounds. Musculoskeletal: Full range of motion to all extremities. No gross deformities appreciated. Neurologic:  Normal speech and language. No gross focal neurologic deficits are appreciated.  Skin:  Skin is warm, dry and intact. No rash noted. Psychiatric: Mood and affect are normal. Speech and behavior are normal. Patient exhibits appropriate insight and judgement.   ____________________________________________   LABS (all labs ordered are listed, but only abnormal results are displayed)  Labs Reviewed  GROUP A STREP BY PCR   ____________________________________________  EKG   ____________________________________________  RADIOLOGY Festus BarrenI, Romyn Boswell D Harlea Goetzinger, personally viewed and evaluated these images (plain radiographs) as part of my medical decision making, as well as reviewing the written report by the radiologist.  I concur with radiologist with no findings consistent with consolidation or pleural effusion  Dg Chest 2 View  Result Date: 10/01/2017 CLINICAL DATA:  Cough for 2 weeks EXAM: CHEST  2 VIEW COMPARISON:  09/15/2016 FINDINGS: Mild bronchitic changes. No acute consolidation or effusion. Normal heart size. No pneumothorax. IMPRESSION: No active cardiopulmonary disease.  Mild bronchitic changes Electronically Signed   By: Jasmine PangKim  Fujinaga M.D.   On: 10/01/2017 22:09     ____________________________________________    PROCEDURES  Procedure(s) performed:    Procedures    Medications - No data to display   ____________________________________________   INITIAL IMPRESSION / ASSESSMENT AND PLAN / ED COURSE  Pertinent labs & imaging results that were available during my care of the patient were reviewed by me and considered in my medical decision making (see chart for details).  Review of the  CSRS was performed in accordance of the [facility] prior to dispensing any controlled drugs.     Patient's diagnosis is consistent with viral URI.  Presented with symptoms consistent with viral URI.  Initial differential included strep, viral pharyngitis, bronchitis, pneumonia, influenza.  Patient does not have any fevers or chills or body aches.  Negative strep test.  Chest x-ray revealed no consolidation consistent with pneumonia.  She was extremely upset with myself that I did not answer the question why her "throat hurt."  I explained to the patient that she has a viral syndrome with sore throat.  Patient is upset that I have not "fixed [her] throat" in the emergency department.  Patient is advised that this would take several days until the virus is under control.  On exam, patient does not have any erythema, edema, uvula is midline.  No signs of muffled or scratchy voice.  No  stridor.  No indication for further workup of the throat.. Patient will be discharged home with prescriptions for Bromfed cough syrup and Magic mouthwash for symptom control. Patient is to follow up with primary care as needed or otherwise directed. Patient is given ED precautions to return to the ED for any worsening or new symptoms.     ____________________________________________  FINAL CLINICAL IMPRESSION(S) / ED DIAGNOSES  Final diagnoses:  Viral URI with cough      NEW MEDICATIONS STARTED DURING THIS VISIT:  ED Discharge Orders        Ordered    magic mouthwash  w/lidocaine SOLN  4 times daily    Comments:  Dispense in a 1/1/1/1 ratio. Use lidocaine, diphenhydramine, prednisolone, nystatin   10/01/17 2237    brompheniramine-pseudoephedrine-DM 30-2-10 MG/5ML syrup  4 times daily PRN     10/01/17 2237          This chart was dictated using voice recognition software/Dragon. Despite best efforts to proofread, errors can occur which can change the meaning. Any change was purely unintentional.    Racheal Patches, PA-C 10/01/17 2246    Pershing Proud Myra Rude, MD 10/01/17 2325

## 2017-10-01 NOTE — ED Triage Notes (Addendum)
Pt presents to ED with nasal congestion, ear pain, sore throat, and productive cough. Denies fever.

## 2017-10-01 NOTE — ED Notes (Signed)
Pt reports coughing with chest pain.  Pt states she has had runny nose, ears hurt and pain when swallowing.  Pt is A&Ox4, in NAD.

## 2017-12-22 IMAGING — CT CT RENAL STONE PROTOCOL
2 of 5 series · 16 of 46 positions shown, 18 images · non-contrast
Comparison: 10/31/2014

CLINICAL DATA: Left flank pain and lower pelvic pain for 2 days.
Dark urine.

EXAM:
CT ABDOMEN AND PELVIS WITHOUT CONTRAST
TECHNIQUE: Multidetector CT imaging of the abdomen and pelvis was performed
following the standard protocol without IV contrast.

[Series 2: stone full standard · axial · 0.76mm/px · z∈[-556,-141]mm · 13 of 93 slices shown, 15 images]
[im 5/93  soft-tissue]
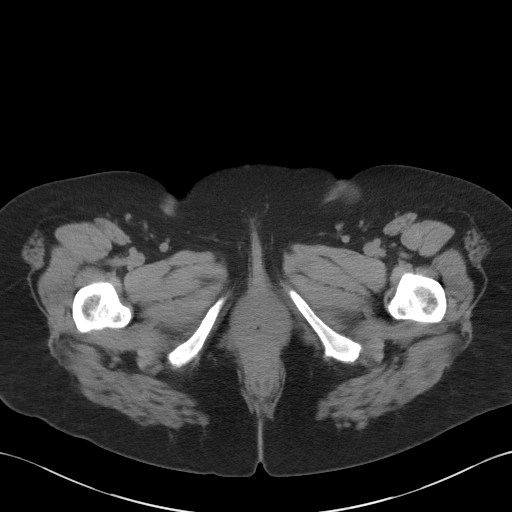
[im 5/93  bone]
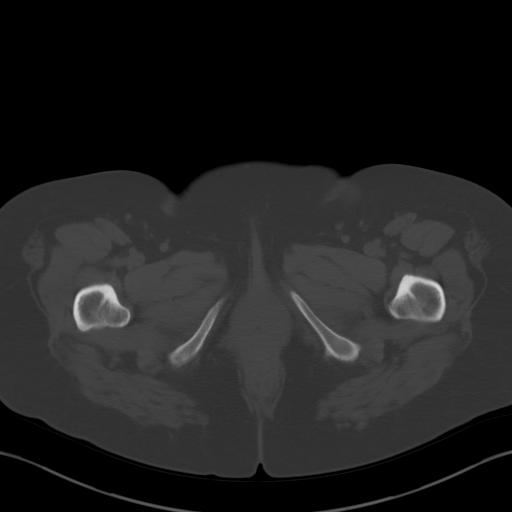
[im 14/93  soft-tissue]
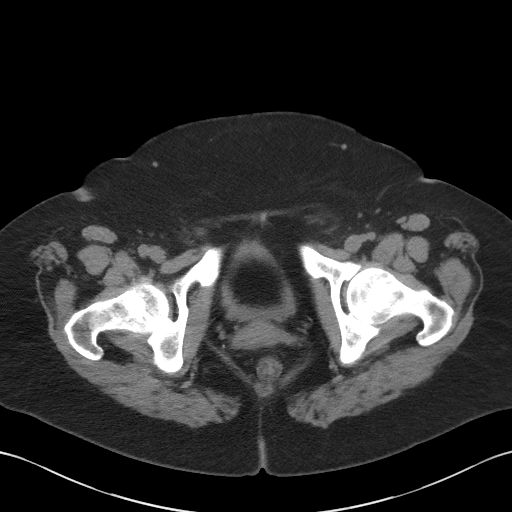
[im 19/93  soft-tissue]
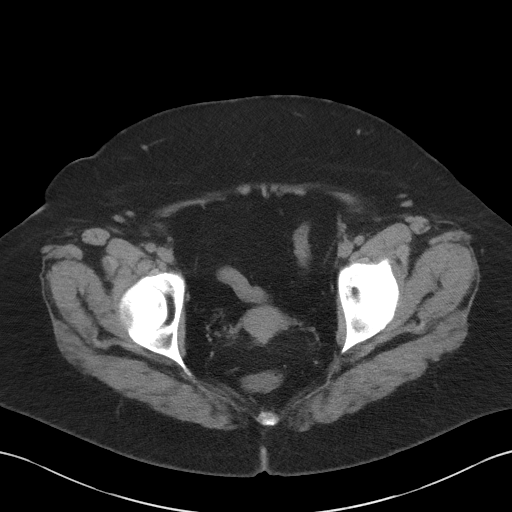
[im 28/93  soft-tissue]
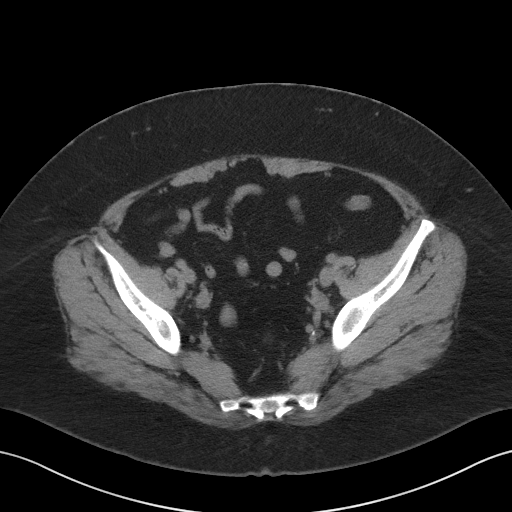
[im 33/93  soft-tissue]
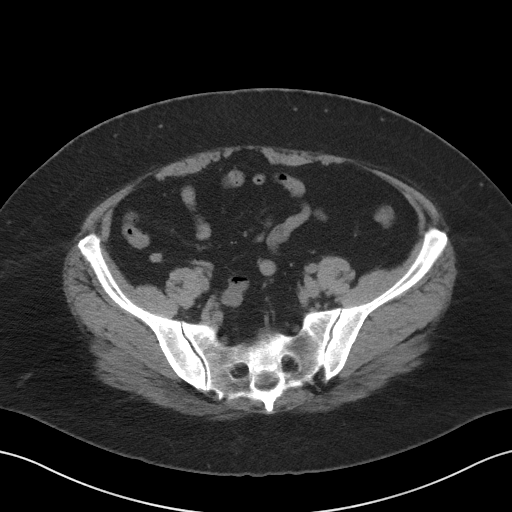
[im 42/93  soft-tissue]
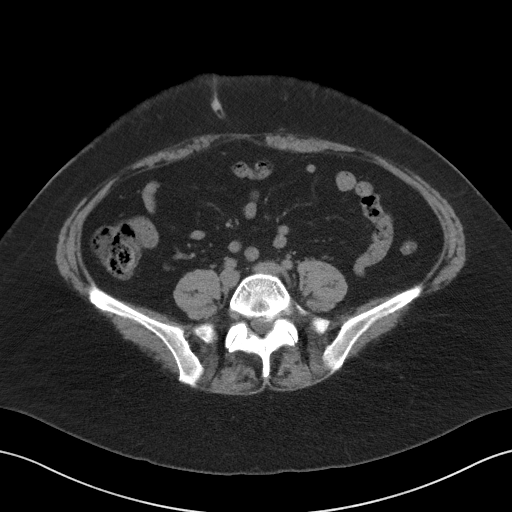
[im 47/93  soft-tissue]
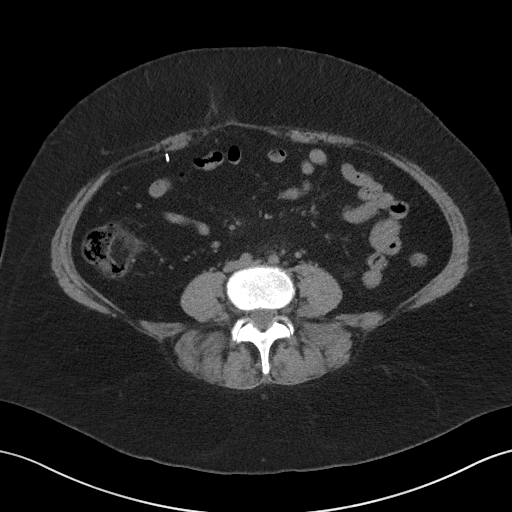
[im 51/93  soft-tissue]
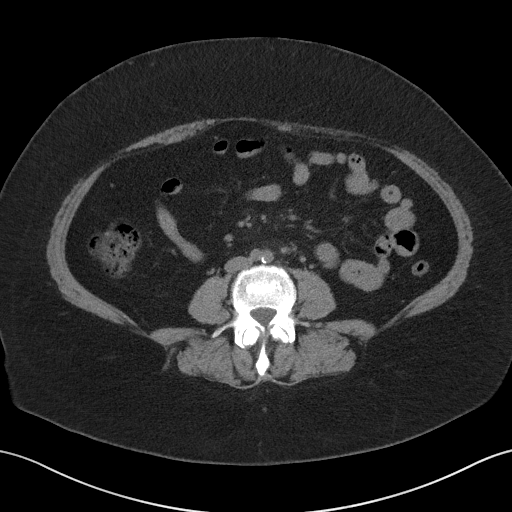
[im 60/93  soft-tissue]
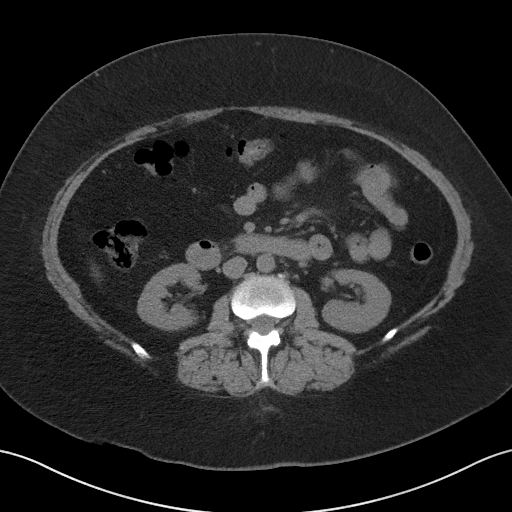
[im 60/93  bone]
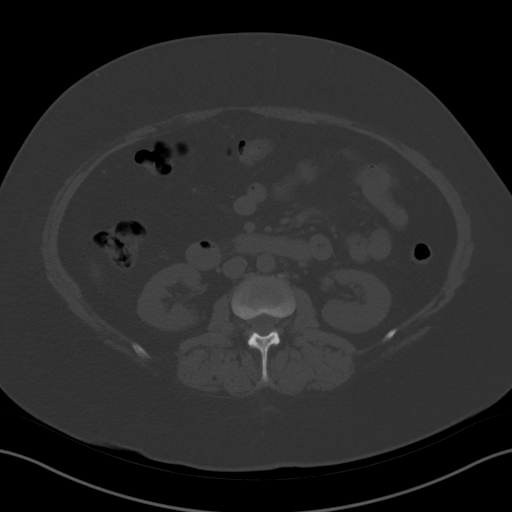
[im 65/93  soft-tissue]
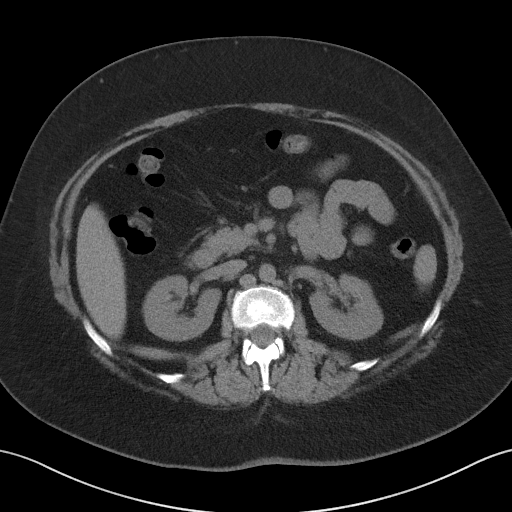
[im 74/93  soft-tissue]
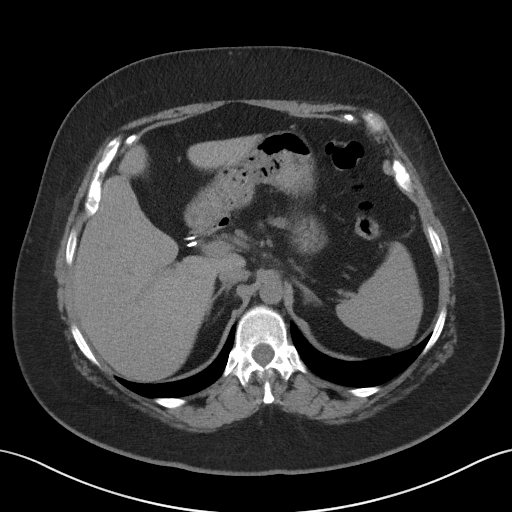
[im 79/93  soft-tissue]
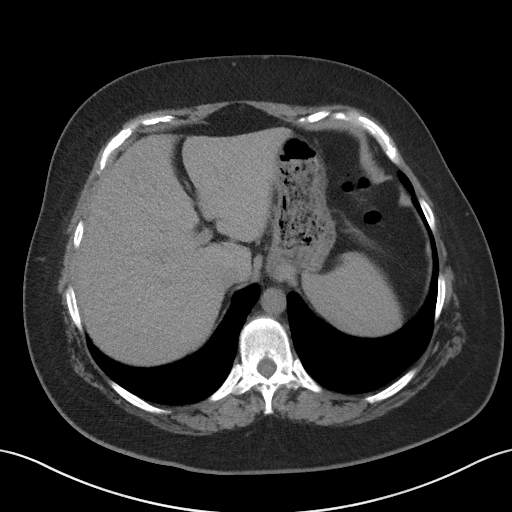
[im 88/93  soft-tissue]
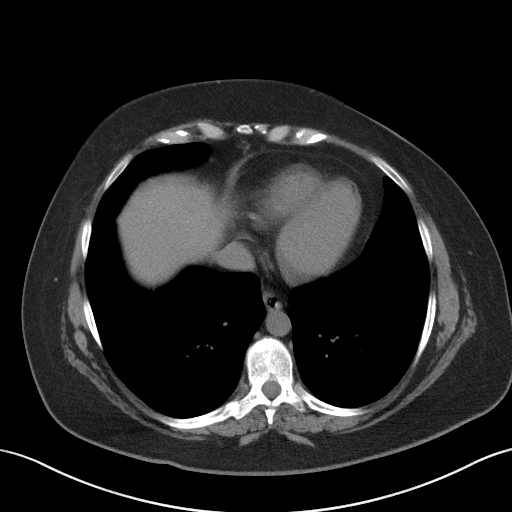

[Series 7: coronal · coronal · 0.78mm/px · 3 of 151 slices shown]
[im 51/151  soft-tissue]
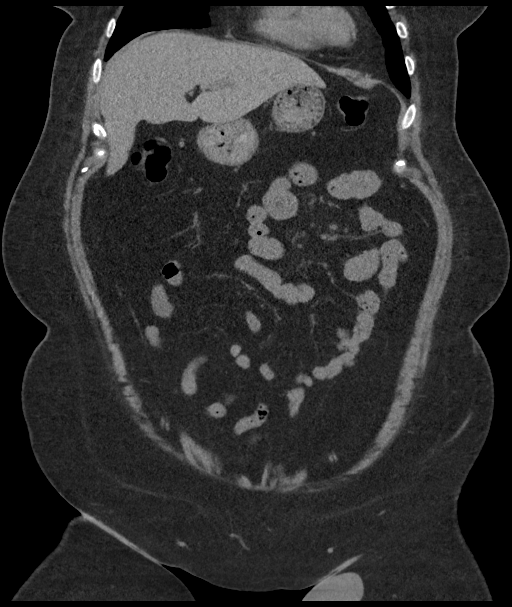
[im 67/151  soft-tissue]
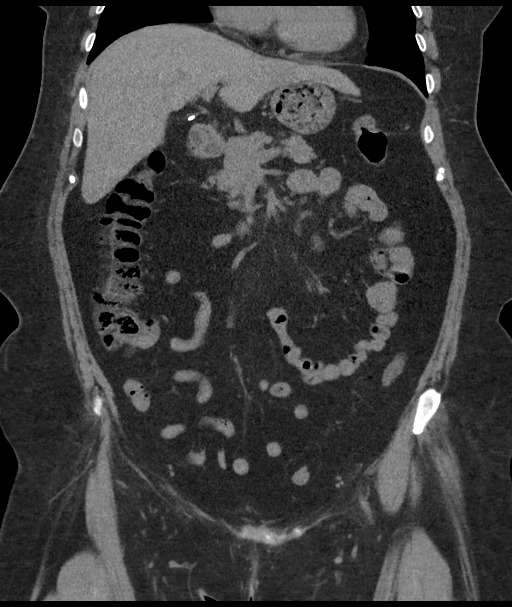
[im 84/151  soft-tissue]
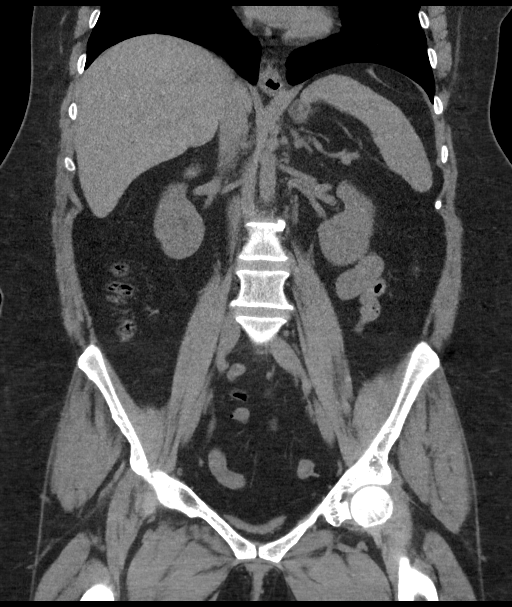

[16 of 46 positions shown; findings below may reference images not displayed]

FINDINGS: Lower chest: Small esophageal hiatal hernia. Mild thickening of
lower esophageal wall probably represents reflux disease.

Hepatobiliary: No focal liver abnormality is seen. Status post
cholecystectomy. No biliary dilatation.

Pancreas: Unremarkable. No pancreatic ductal dilatation or
surrounding inflammatory changes.

Spleen: Calcified granuloma in the spleen.

Adrenals/Urinary Tract: No adrenal gland nodules. Kidneys are
symmetrical. No hydronephrosis or hydroureter. No renal, ureteral,
or bladder stones. Bladder is decompressed.

Stomach/Bowel: Stomach is within normal limits. Appendix appears
normal. No evidence of bowel wall thickening, distention, or
inflammatory changes.

Vascular/Lymphatic: Mild aortic calcification. No aneurysm. Normal
caliber inferior vena cava. Suggestion of minimal infiltration in
the retroperitoneal fat is nonspecific but could represent
inflammatory process. No significant lymphadenopathy.

Reproductive: Uterus and ovaries are not enlarged.

Other: There is a 4.5 cm diameter circumscribed solid appearing
lesion in the subcutaneous fat of the left groin. This is unchanged
in size or appearance since previous study.

Musculoskeletal: Degenerative changes in the lumbar spine. No
destructive bone lesions.
IMPRESSION: No renal or ureteral stone or obstruction. Suggestion of mild
infiltration in the retroperitoneal fat of nonspecific etiology,
possibly inflammatory. Unchanged appearance of soft tissue density
structure in the subcutaneous tissues inferior to the left groin.
Small esophageal hiatal hernia with probable reflux changes.

## 2018-02-28 LAB — HM PAP SMEAR: HM Pap smear: NEGATIVE

## 2018-02-28 LAB — HM HIV SCREENING LAB: HM HIV Screening: NEGATIVE

## 2018-06-11 ENCOUNTER — Other Ambulatory Visit: Payer: Self-pay

## 2018-06-11 ENCOUNTER — Emergency Department: Payer: Medicaid Other

## 2018-06-11 ENCOUNTER — Emergency Department
Admission: EM | Admit: 2018-06-11 | Discharge: 2018-06-11 | Disposition: A | Discharge status: Home / Self Care | Payer: Medicaid Other | Attending: Student in an Organized Health Care Education/Training Program | Admitting: Student in an Organized Health Care Education/Training Program

## 2018-06-11 ENCOUNTER — Encounter: Payer: Self-pay | Admitting: Emergency Medicine

## 2018-06-11 DIAGNOSIS — Z79899 Other long term (current) drug therapy: Secondary | ICD-10-CM | POA: Insufficient documentation

## 2018-06-11 DIAGNOSIS — J209 Acute bronchitis, unspecified: Secondary | ICD-10-CM | POA: Insufficient documentation

## 2018-06-11 DIAGNOSIS — F17210 Nicotine dependence, cigarettes, uncomplicated: Secondary | ICD-10-CM | POA: Insufficient documentation

## 2018-06-11 MED ORDER — PSEUDOEPH-BROMPHEN-DM 30-2-10 MG/5ML PO SYRP: 10.0000 mL | ORAL_SOLUTION | Freq: Four times a day (QID) | ORAL | 0 refills | Status: DC | PRN

## 2018-06-11 MED ORDER — ALBUTEROL SULFATE HFA 108 (90 BASE) MCG/ACT IN AERS: 2.0000 | INHALATION_SPRAY | RESPIRATORY_TRACT | 0 refills | Status: DC | PRN

## 2018-06-11 MED ORDER — PREDNISONE 50 MG PO TABS: 50.0000 mg | ORAL_TABLET | Freq: Every day | ORAL | 0 refills | Status: DC

## 2018-06-11 NOTE — ED Triage Notes (Signed)
Patient reports productive cough with green phlegm and nasal congestion x3-4 days. Denies any fever at home.

## 2018-06-11 NOTE — ED Notes (Signed)
See triage note  Presents with cough and cold sxs' for about 4 days   States cough was prod at times

## 2018-06-11 NOTE — ED Provider Notes (Signed)
[facility] Emergency Department Provider Note  ____________________________________________  Time seen: Approximately 3:34 PM  I have reviewed the triage vital signs and the nursing notes.   HISTORY  Chief Complaint Cough and Nasal Congestion    HPI Grace Evans is a 42 y.o. female who presents the emergency department for 3 to 4-day history of nasal congestion, ear pressure, cough, malaise.  Patient states that she has had a "cold" for several days that is worsening.  Patient denies any headache, neck pain or stiffness, chest pain, shortness of breath, abdominal pain, nausea vomiting.  Patient denies any sore throat or difficulty swallowing.  Patient has not tried any medications at home.  She denies a fever but states that she has felt hot, has had some sweating at home.  Patient denies any other symptoms or complaints at this time.    Past Medical History:  Diagnosis Date  . GERD (gastroesophageal reflux disease)     There are no active problems to display for this patient.   Past Surgical History:  Procedure Laterality Date  . CHOLECYSTECTOMY      Prior to Admission medications   Medication Sig Start Date End Date Taking? Authorizing Provider  albuterol (PROVENTIL HFA;VENTOLIN HFA) 108 (90 Base) MCG/ACT inhaler Inhale 2 puffs into the lungs every 4 (four) hours as needed for wheezing or shortness of breath. 06/11/18   Cuthriell, Delorise Royals, PA-C  brompheniramine-pseudoephedrine-DM 30-2-10 MG/5ML syrup Take 10 mLs by mouth 4 (four) times daily as needed. 06/11/18   Cuthriell, Delorise Royals, PA-C  fluticasone (FLONASE) 50 MCG/ACT nasal spray Place 1 spray into both nostrils 2 (two) times daily. 09/15/17   Cuthriell, Delorise Royals, PA-C  HYDROcodone-acetaminophen (NORCO/VICODIN) 5-325 MG tablet Take 1 tablet by mouth every 4 (four) hours as needed. 09/10/16   Minna Antis, MD  magic mouthwash w/lidocaine SOLN Take 5 mLs by mouth 4 (four) times daily. 10/01/17    Cuthriell, Delorise Royals, PA-C  predniSONE (DELTASONE) 50 MG tablet Take 1 tablet (50 mg total) by mouth daily with breakfast. 06/11/18   Cuthriell, Delorise Royals, PA-C    Allergies Patient has no known allergies.  No family history on file.  Social History Social History   Tobacco Use  . Smoking status: Current Every Day Smoker    Packs/day: 1.00    Types: Cigarettes  . Smokeless tobacco: Never Used  Substance Use Topics  . Alcohol use: No  . Drug use: No     Review of Systems  Constitutional: Subjective tactile fever/chills Eyes: No visual changes. No discharge ENT: Positive for nasal congestion and ear fullness bilaterally.  No sore throat. Cardiovascular: no chest pain. Respiratory: Positive cough. No SOB. Gastrointestinal: No abdominal pain.  No nausea, no vomiting.  No diarrhea.  No constipation. Musculoskeletal: Negative for musculoskeletal pain. Skin: Negative for rash, abrasions, lacerations, ecchymosis. Neurological: Negative for headaches, focal weakness or numbness. 10-point ROS otherwise negative.  ____________________________________________   PHYSICAL EXAM:  VITAL SIGNS: ED Triage Vitals  Enc Vitals Group     BP 06/11/18 1502 98/77     Pulse Rate 06/11/18 1502 94     Resp 06/11/18 1502 20     Temp 06/11/18 1502 98.3 F (36.8 C)     Temp Source 06/11/18 1502 Oral     SpO2 06/11/18 1502 97 %     Weight 06/11/18 1502 215 lb (97.5 kg)     Height 06/11/18 1502 5\' 6"  (1.676 m)     Head Circumference --  Peak Flow --      Pain Score 06/11/18 1505 6     Pain Loc --      Pain Edu? --      Excl. in GC? --      Constitutional: Alert and oriented. Well appearing and in no acute distress. Eyes: Conjunctivae are normal. PERRL. EOMI. Head: Atraumatic. ENT:      Ears: EACs unremarkable bilaterally.  TMs are unremarkable bilaterally.      Nose: Moderate clear congestion/rhinnorhea.      Mouth/Throat: Mucous membranes are moist.  Oropharynx is  nonerythematous and nonedematous.  Uvula is midline.   Neck: No stridor.  No cervical spine tenderness to palpation.  Neck is supple full range of motion Hematological/Lymphatic/Immunilogical: No cervical lymphadenopathy. Cardiovascular: Normal rate, regular rhythm. Normal S1 and S2.  Good peripheral circulation. Respiratory: Normal respiratory effort without tachypnea or retractions. Lungs with scattered coarse breath sounds.  No wheezing, rales, rhonchi.Peri Jefferson air entry to the bases with no decreased or absent breath sounds. Musculoskeletal: Full range of motion to all extremities. No gross deformities appreciated. Neurologic:  Normal speech and language. No gross focal neurologic deficits are appreciated.  Skin:  Skin is warm, dry and intact. No rash noted. Psychiatric: Mood and affect are normal. Speech and behavior are normal. Patient exhibits appropriate insight and judgement.   ____________________________________________   LABS (all labs ordered are listed, but only abnormal results are displayed)  Labs Reviewed - No data to display ____________________________________________  EKG   ____________________________________________  RADIOLOGY I personally viewed and evaluated these images as part of my medical decision making, as well as reviewing the written report by the radiologist.  No consolidation concerning for pneumonia.  Peribronchial thickening consistent with bronchitis.  Dg Chest 2 View  Result Date: 06/11/2018 CLINICAL DATA:  Productive cough with stuffy nose and congested ears. Smoker. EXAM: CHEST - 2 VIEW COMPARISON:  Radiographs 10/01/2017 and 09/15/2017. FINDINGS: The heart size and mediastinal contours are stable. There is possible mild central airway thickening, similar to recent prior studies. No airspace disease, edema, pleural effusion or pneumothorax. No acute osseous findings are demonstrated. IMPRESSION: No acute findings or significant changes from recent  prior studies. Possible mild central airway thickening, suggesting bronchitis. Electronically Signed   By: Carey Bullocks M.D.   On: 06/11/2018 15:56    ____________________________________________    PROCEDURES  Procedure(s) performed:    Procedures    Medications - No data to display   ____________________________________________   INITIAL IMPRESSION / ASSESSMENT AND PLAN / ED COURSE  Pertinent labs & imaging results that were available during my care of the patient were reviewed by me and considered in my medical decision making (see chart for details).  Review of the [place] CSRS was performed in accordance of the [facility] prior to dispensing any controlled drugs.      Patient's diagnosis is consistent with bronchitis.  Patient presented with several day history of URI symptoms.  Exam was overall reassuring.  Chest x-ray revealed no consolidation concerning for pneumonia.. Patient will be discharged home with prescriptions for albuterol, prednisone, Bromfed cough syrup.. Patient is to follow up with primary care as needed or otherwise directed. Patient is given ED precautions to return to the ED for any worsening or new symptoms.     ____________________________________________  FINAL CLINICAL IMPRESSION(S) / ED DIAGNOSES  Final diagnoses:  Acute bronchitis, unspecified organism      NEW MEDICATIONS STARTED DURING THIS VISIT:  ED Discharge Orders  Ordered    albuterol (PROVENTIL HFA;VENTOLIN HFA) 108 (90 Base) MCG/ACT inhaler  Every 4 hours PRN     06/11/18 1607    predniSONE (DELTASONE) 50 MG tablet  Daily with breakfast     06/11/18 1607    brompheniramine-pseudoephedrine-DM 30-2-10 MG/5ML syrup  4 times daily PRN     06/11/18 1607              This chart was dictated using voice recognition software/Dragon. Despite best efforts to proofread, errors can occur which can change the meaning. Any change was purely unintentional.    Racheal Patches, PA-C 06/11/18 1608    Willy Eddy, MD 06/11/18 2029

## 2019-04-01 ENCOUNTER — Ambulatory Visit (LOCAL_COMMUNITY_HEALTH_CENTER): Payer: Self-pay

## 2019-04-01 ENCOUNTER — Other Ambulatory Visit: Payer: Self-pay

## 2019-04-01 DIAGNOSIS — B852 Pediculosis, unspecified: Secondary | ICD-10-CM

## 2019-04-01 MED ORDER — NIX CREME RINSE 1 % EX LIQD: Freq: Once | CUTANEOUS | 0 refills | Status: AC

## 2019-05-15 DIAGNOSIS — Z8659 Personal history of other mental and behavioral disorders: Secondary | ICD-10-CM

## 2019-05-15 DIAGNOSIS — E669 Obesity, unspecified: Secondary | ICD-10-CM

## 2019-05-15 DIAGNOSIS — Z6836 Body mass index (BMI) 36.0-36.9, adult: Secondary | ICD-10-CM

## 2019-05-16 ENCOUNTER — Other Ambulatory Visit: Payer: Self-pay

## 2019-05-16 ENCOUNTER — Encounter: Payer: Self-pay | Admitting: Physician Assistant

## 2019-05-16 ENCOUNTER — Ambulatory Visit: Payer: Medicaid Other | Admitting: Physician Assistant

## 2019-05-16 VITALS — BP 106/72 | Ht 65.0 in | Wt 217.0 lb

## 2019-05-16 DIAGNOSIS — Z3009 Encounter for other general counseling and advice on contraception: Secondary | ICD-10-CM

## 2019-05-16 DIAGNOSIS — Z30013 Encounter for initial prescription of injectable contraceptive: Secondary | ICD-10-CM

## 2019-05-16 DIAGNOSIS — Z3042 Encounter for surveillance of injectable contraceptive: Secondary | ICD-10-CM

## 2019-05-16 DIAGNOSIS — Z113 Encounter for screening for infections with a predominantly sexual mode of transmission: Secondary | ICD-10-CM

## 2019-05-16 MED ORDER — MEDROXYPROGESTERONE ACETATE 150 MG/ML IM SUSP
150.0000 mg | INTRAMUSCULAR | Status: AC
  Administered 2019-05-16 – 2019-07-30 (×2): 150 mg via INTRAMUSCULAR

## 2019-05-16 NOTE — Progress Notes (Signed)
Pt here for Depo restart. Last Depo was 12/26/2018. Pt reports having issues with vomiting that started about 5 years ago and is concerned if it could be caused by the Depo. Desires blood work for HIV and syphillis.Ronny Bacon, RN

## 2019-05-17 NOTE — Progress Notes (Signed)
Family Planning Visit  Subjective:  Grace Evans is a 43 y.o. being seen today to restart Depo.    She is currently using abstinence for pregnancy prevention. Patient reports she does not  want a pregnancy in the next year. Patient  has History of panic attacks and Obesity, unspecified on their problem list.  Chief Complaint  Patient presents with  . Contraception    here for Depo    Patient reports that she last had sex over a year ago.  Would like to restart Depo.  States that she has been evaluated in the past and dx with GERD.  Reports that she still has trouble with nausea and vomiting "all the time".  Requests HIV and RPR blood work today and declines HA1C today stating she will get this done at her physical.  Patient denies any change to personal and family history since last RP 02/2018.   Does the patient desire a pregnancy in the next year? ([facility] flowsheet)  See flowsheet for other program required questions.   Body mass index is 36.11 kg/m. - Patient is eligible for diabetes screening based on BMI and age >75?  yes HA1C ordered? no  Patient reports 0 of partners in last year. Desires STI screening?  No - .  Does the patient have a current or past history of drug use? No   No components found for: HCV]   Health Maintenance Due  Topic Date Due  . TETANUS/TDAP  07/12/1995  . INFLUENZA VACCINE  03/08/2019    Review of Systems  All other systems reviewed and are negative.   The following portions of the patient's history were reviewed and updated as appropriate: allergies, current medications, past family history, past medical history, past social history, past surgical history and problem list. Problem list updated.  Objective:   Vitals:   05/16/19 1400  BP: 106/72  Weight: 217 lb (98.4 kg)  Height: 5\' 5"  (1.651 m)    Physical Exam Vitals signs reviewed.  Constitutional:      General: She is not in acute distress.    Appearance: Normal appearance.  HENT:      Head: Normocephalic and atraumatic.  Pulmonary:     Effort: Pulmonary effort is normal.  Neurological:     Mental Status: She is alert and oriented to person, place, and time.  Psychiatric:        Mood and Affect: Mood normal.        Behavior: Behavior normal.        Thought Content: Thought content normal.        Judgment: Judgment normal.       Assessment and Plan:  Grace Evans is a 43 y.o. female presenting to the [facility] Department to restart Depo.  Contraception counseling: Reviewed Depo with patient.   Risks and benefits reviewed.  Questions were answered.  Patient desires to restart Depo , this was prescribed for patient. She will follow up in  3 months and prn  for surveillance.  She was told to call with any further questions, or with any concerns about this method of contraception.  Emphasized use of condoms 100% of the time for STI prevention.  1. Encounter for counseling regarding contraception Reviewed with patient normal SE of Depo and when to call clinic for irregular bleeding. Counseled patient that likely not Depo that is causing her nausea and vomiting and that she should see PCP for further evaluation. PCP list given for patient to  establish care and follow up re:  GERD/nausea and vomiting and for age appropriate screenings.  2. Screening for STD (sexually transmitted disease) Patient requests HIV and Syphilis blood work today. Await test results.  Counseled that RN will call if needs to RTC once results are back.  - HIV [place] [facility] - Syphilis Serology,  Lab  3. Surveillance for Depo-Provera contraception OK for Depo 150mg  IM today and q 11-13 weeks for 1 year. Rec condoms with all sex for STD protection and especially for 2 weeks after shot today. Counseled patient to schedule for RP when time for next Depo if we are doing physicals at that time. - medroxyPROGESTERone (DEPO-PROVERA) injection 150 mg     No follow-ups on  file.  No future appointments.  Jerene Dilling, PA

## 2019-06-11 ENCOUNTER — Telehealth: Payer: Self-pay | Admitting: Family Medicine

## 2019-06-11 NOTE — Telephone Encounter (Signed)
Needs lice medication

## 2019-06-11 NOTE — Telephone Encounter (Signed)
Phone call to client to schedule a head lice appointment. Per female answering phone, client is not there currently. Left message to have client schedule an appt for head lice evaluation. Rich Number, RN

## 2019-06-17 ENCOUNTER — Ambulatory Visit (LOCAL_COMMUNITY_HEALTH_CENTER): Payer: Medicaid Other

## 2019-06-17 ENCOUNTER — Other Ambulatory Visit: Payer: Self-pay

## 2019-06-17 DIAGNOSIS — B850 Pediculosis due to Pediculus humanus capitis: Secondary | ICD-10-CM

## 2019-06-17 DIAGNOSIS — B852 Pediculosis, unspecified: Secondary | ICD-10-CM

## 2019-06-17 MED ORDER — NIX CREME RINSE 1 % EX LIQD: Freq: Once | CUTANEOUS | 0 refills | Status: AC

## 2019-06-17 NOTE — Progress Notes (Signed)
Provided [facility] instruction sheet and emphasized also following instructions in/on the box. Pt has 6 people living in household, youngest child is 61 months old.

## 2019-06-18 ENCOUNTER — Emergency Department: Payer: Medicaid Other

## 2019-06-18 ENCOUNTER — Emergency Department
Admission: EM | Admit: 2019-06-18 | Discharge: 2019-06-18 | Disposition: A | Discharge status: Home / Self Care | Payer: Medicaid Other | Attending: Emergency Medicine | Admitting: Emergency Medicine

## 2019-06-18 ENCOUNTER — Emergency Department: Payer: Self-pay

## 2019-06-18 ENCOUNTER — Encounter: Payer: Self-pay | Admitting: Emergency Medicine

## 2019-06-18 ENCOUNTER — Other Ambulatory Visit: Payer: Self-pay

## 2019-06-18 DIAGNOSIS — R202 Paresthesia of skin: Secondary | ICD-10-CM | POA: Insufficient documentation

## 2019-06-18 DIAGNOSIS — R0789 Other chest pain: Secondary | ICD-10-CM | POA: Insufficient documentation

## 2019-06-18 DIAGNOSIS — F17210 Nicotine dependence, cigarettes, uncomplicated: Secondary | ICD-10-CM | POA: Insufficient documentation

## 2019-06-18 HISTORY — DX: Hyperlipidemia, unspecified: E78.5

## 2019-06-18 LAB — BASIC METABOLIC PANEL
Anion gap: 9 (ref 5–15)
BUN: 9 mg/dL (ref 6–20)
CO2: 23 mmol/L (ref 22–32)
Calcium: 8.7 mg/dL — ABNORMAL LOW (ref 8.9–10.3)
Chloride: 109 mmol/L (ref 98–111)
Creatinine, Ser: 0.63 mg/dL (ref 0.44–1.00)
GFR calc Af Amer: >60 mL/min (ref >60)
GFR calc non Af Amer: >60 mL/min (ref >60)
Glucose, Bld: 117 mg/dL — ABNORMAL HIGH (ref 70–99)
Potassium: 3.7 mmol/L (ref 3.5–5.1)
Sodium: 141 mmol/L (ref 135–145)

## 2019-06-18 LAB — CBC
HCT: 42.6 % (ref 36.0–46.0)
Hemoglobin: 14.3 g/dL (ref 12.0–15.0)
MCH: 29.7 pg (ref 26.0–34.0)
MCHC: 33.6 g/dL (ref 30.0–36.0)
MCV: 88.6 fL (ref 80.0–100.0)
Platelets: 304 10*3/uL (ref 150–400)
RBC: 4.81 MIL/uL (ref 3.87–5.11)
RDW: 12.9 % (ref 11.5–15.5)
WBC: 10.1 10*3/uL (ref 4.0–10.5)
nRBC: 0 % (ref 0.0–0.2)

## 2019-06-18 LAB — TROPONIN I (HIGH SENSITIVITY): Troponin I (High Sensitivity): <2 ng/L (ref <18)

## 2019-06-18 MED ORDER — NAPROXEN 500 MG PO TABS: 500.0000 mg | ORAL_TABLET | Freq: Two times a day (BID) | ORAL | 0 refills | Status: AC

## 2019-06-18 MED ORDER — CYCLOBENZAPRINE HCL 5 MG PO TABS: 5.0000 mg | ORAL_TABLET | Freq: Three times a day (TID) | ORAL | 0 refills | Status: AC | PRN

## 2019-06-18 MED ORDER — SODIUM CHLORIDE 0.9% FLUSH: 3.0000 mL | Freq: Once | INTRAVENOUS | Status: DC

## 2019-06-18 NOTE — ED Triage Notes (Signed)
Pt to ED from home c/o right leg and arm tingling and numbness x2 days that is intermittent.  Denies hx of same.  Patient is A&Ox4, has been ambulatory at home.  Denies blood thinners.

## 2019-06-18 NOTE — Discharge Instructions (Signed)
Take the Naprosyn twice daily as prescribed to decrease inflammation, and the muscle relaxer up to 3 times daily as needed.  Return to the ER for new, worsening, or persistent severe numbness or tingling, any weakness in arm or leg, or any other neurologic symptoms such as changes in your vision, confusion, difficulty speaking, problems with coordination, or any other new or worsening symptoms that concern you.

## 2019-06-18 NOTE — ED Provider Notes (Signed)
[facility] Emergency Department Provider Note ____________________________________________   First MD Initiated Contact with Patient 06/18/19 1545     (approximate)  I have reviewed the triage vital signs and the nursing notes.   HISTORY  Chief Complaint Numbness and Chest Pain    HPI Grace Evans is a 43 y.o. female with PMH as noted below who presents primarily with paresthesias of the right leg, acute onset today, involving what she describes as the whole leg, and described mainly as tingling or altered sensation.  The patient states that she is not actually numb or unable to feel.  She denies any weakness.  She denies any trauma.  She also reports some mild tingling in the right arm.  The patient reports intermittent chest discomfort over the last few days but denies active chest pain, and has no shortness of breath.  She denies generalized weakness or lightheadedness.  Past Medical History:  Diagnosis Date  . GERD (gastroesophageal reflux disease)   . Hyperlipidemia     Patient Active Problem List   Diagnosis Date Noted  . Obesity, unspecified 05/08/2012  . History of panic attacks 07/24/2006    Past Surgical History:  Procedure Laterality Date  . CHOLECYSTECTOMY      Prior to Admission medications   Medication Sig Start Date End Date Taking? Authorizing Provider  albuterol (PROVENTIL HFA;VENTOLIN HFA) 108 (90 Base) MCG/ACT inhaler Inhale 2 puffs into the lungs every 4 (four) hours as needed for wheezing or shortness of breath. Patient not taking: Reported on 05/16/2019 06/11/18   Cuthriell, Delorise Royals, PA-C  brompheniramine-pseudoephedrine-DM 30-2-10 MG/5ML syrup Take 10 mLs by mouth 4 (four) times daily as needed. Patient not taking: Reported on 05/16/2019 06/11/18   Cuthriell, Delorise Royals, PA-C  cyclobenzaprine (FLEXERIL) 5 MG tablet Take 1 tablet (5 mg total) by mouth 3 (three) times daily as needed for up to 5 days. 06/18/19 06/23/19   Dionne Bucy, MD  fluticasone (FLONASE) 50 MCG/ACT nasal spray Place 1 spray into both nostrils 2 (two) times daily. Patient not taking: Reported on 05/16/2019 09/15/17   Cuthriell, Delorise Royals, PA-C  HYDROcodone-acetaminophen (NORCO/VICODIN) 5-325 MG tablet Take 1 tablet by mouth every 4 (four) hours as needed. Patient not taking: Reported on 05/16/2019 09/10/16   Minna Antis, MD  magic mouthwash w/lidocaine SOLN Take 5 mLs by mouth 4 (four) times daily. Patient not taking: Reported on 05/16/2019 10/01/17   Cuthriell, Delorise Royals, PA-C  naproxen (NAPROSYN) 500 MG tablet Take 1 tablet (500 mg total) by mouth 2 (two) times daily with a meal for 15 days. 06/18/19 07/03/19  Dionne Bucy, MD  predniSONE (DELTASONE) 50 MG tablet Take 1 tablet (50 mg total) by mouth daily with breakfast. Patient not taking: Reported on 05/16/2019 06/11/18   Cuthriell, Delorise Royals, PA-C    Allergies Patient has no known allergies.  Family History  Problem Relation Age of Onset  . Thyroid cancer Mother   . Heart attack Mother   . Congestive Heart Failure Father   . Heart attack Father   . Hyperlipidemia Father   . Seizures Son   . Diabetes Maternal Grandmother   . Stroke Maternal Grandmother   . Stroke Maternal Grandfather     Social History Social History   Tobacco Use  . Smoking status: Current Every Day Smoker    Packs/day: 1.00    Types: Cigarettes  . Smokeless tobacco: Never Used  Substance Use Topics  . Alcohol use: No  . Drug use: No  Review of Systems  Constitutional: No fever/chills. Eyes: No visual changes. ENT: No sore throat. Cardiovascular: Positive for mild intermittent chest pain. Respiratory: Denies shortness of breath. Gastrointestinal: No vomiting or diarrhea.  Genitourinary: Negative for dysuria.  Musculoskeletal: Negative for back pain. Skin: Negative for rash. Neurological: Negative for headache.  Positive for right leg tingling.    ____________________________________________   PHYSICAL EXAM:  VITAL SIGNS: ED Triage Vitals [06/18/19 1348]  Enc Vitals Group     BP 119/81     Pulse Rate 87     Resp 16     Temp 99 F (37.2 C)     Temp Source Oral     SpO2 99 %     Weight 218 lb (98.9 kg)     Height 5\' 6"  (1.676 m)     Head Circumference      Peak Flow      Pain Score 5     Pain Loc      Pain Edu?      Excl. in [place]?     Constitutional: Alert and oriented. Well appearing and in no acute distress. Eyes: Conjunctivae are normal.  EOMI.  PERRLA. Head: Atraumatic. Nose: No congestion/rhinnorhea. Mouth/Throat: Mucous membranes are moist.   Neck: Normal range of motion.  Cardiovascular: Normal rate, regular rhythm. Good peripheral circulation. Respiratory: Normal respiratory effort.  No retractions.  Gastrointestinal: No distention.  Musculoskeletal: No lower extremity edema.  Extremities warm and well perfused.  Neurologic:  Normal speech and language.  No facial droop.  Motor and sensory intact in all extremities.  Subjective altered sensation in the right lower extremity not following any specific nerve distribution.  Normal coordination with no ataxia. Skin:  Skin is warm and dry. No rash noted. Psychiatric: Mood and affect are normal. Speech and behavior are normal.  ____________________________________________   LABS (all labs ordered are listed, but only abnormal results are displayed)  Labs Reviewed  BASIC METABOLIC PANEL - Abnormal; Notable for the following components:      Result Value   Glucose, Bld 117 (*)    Calcium 8.7 (*)    All other components within normal limits  CBC  TROPONIN I (HIGH SENSITIVITY)  TROPONIN I (HIGH SENSITIVITY)   ____________________________________________  EKG  ED ECG REPORT I, Arta Silence, the attending physician, personally viewed and interpreted this ECG.  Date: 06/18/2019 EKG Time: 1356 Rate: 87 Rhythm: normal sinus rhythm QRS Axis: normal  Intervals: normal  ST/T Wave abnormalities: Nonspecific anterior T wave abnormalities Narrative Interpretation: no evidence of acute ischemia; no significant change when compared to EKG of 07/06/2015  ____________________________________________  RADIOLOGY  CT head: No acute abnormality CXR: No acute abnormality  ____________________________________________   PROCEDURES  Procedure(s) performed: No  Procedures  Critical Care performed: No ____________________________________________   INITIAL IMPRESSION / ASSESSMENT AND PLAN / ED COURSE  Pertinent labs & imaging results that were available during my care of the patient were reviewed by me and considered in my medical decision making (see chart for details).  43 year old female with PMH as noted above presents primarily with paresthesias in the right leg today, with some mild tingling in the right arm that has resolved.  She also reports nonspecific chest discomfort in the last few days but no active chest pain.  I reviewed the past medical records in [place].  Patient has no ED visits or admissions within the last year.  She has no history of stroke or other neurologic abnormalities.  On exam, the patient  is well-appearing.  Her vital signs are normal.  The physical exam is unremarkable.  She does report subjective altered sensation in the anterior right leg, but there is no weakness and the sensory symptoms do not follow any specific dermatome or nerve distribution pattern.    Work-up obtained from triage including CT head and chest x-ray as well as labs are all within normal limits.  Overall, presentation is consistent with radiculopathy, sciatica, peripheral neuropathy, or other benign etiology.  The patient has no evidence of stroke.  There is also no evidence of cauda equina syndrome or significant nerve impingement in the spine.  Given that she has no motor symptoms, and the sensory symptoms are somewhat nonspecific and  intermittent, there is no indication for emergent imaging of the spine.  There is also no evidence for ACS, PE, or other concerning etiology of the patient's intermittent chest discomfort.  Given the patient's well appearance, normal vital signs, lack of significant active symptoms, and the negative work-up, she is stable for discharge home.  I counseled her on the results of the work-up.  I will prescribe an NSAID and Flexeril for symptomatic treatment.  I gave the patient very thorough return precautions and she expressed understanding.  She will follow up with her PMD.  ____________________________________________   FINAL CLINICAL IMPRESSION(S) / ED DIAGNOSES  Final diagnoses:  Paresthesias      NEW MEDICATIONS STARTED DURING THIS VISIT:  New Prescriptions   CYCLOBENZAPRINE (FLEXERIL) 5 MG TABLET    Take 1 tablet (5 mg total) by mouth 3 (three) times daily as needed for up to 5 days.   NAPROXEN (NAPROSYN) 500 MG TABLET    Take 1 tablet (500 mg total) by mouth 2 (two) times daily with a meal for 15 days.     Note:  This document was prepared using Dragon voice recognition software and may include unintentional dictation errors.   Dionne BucySiadecki, Znya Albino, MD 06/18/19 1610

## 2019-06-19 ENCOUNTER — Encounter: Payer: Self-pay | Admitting: Emergency Medicine

## 2019-06-19 ENCOUNTER — Other Ambulatory Visit: Payer: Self-pay

## 2019-06-19 DIAGNOSIS — F17210 Nicotine dependence, cigarettes, uncomplicated: Secondary | ICD-10-CM | POA: Insufficient documentation

## 2019-06-19 DIAGNOSIS — R1013 Epigastric pain: Secondary | ICD-10-CM | POA: Insufficient documentation

## 2019-06-19 LAB — CBC
HCT: 42.4 % (ref 36.0–46.0)
Hemoglobin: 14.5 g/dL (ref 12.0–15.0)
MCH: 29.7 pg (ref 26.0–34.0)
MCHC: 34.2 g/dL (ref 30.0–36.0)
MCV: 86.9 fL (ref 80.0–100.0)
Platelets: 318 10*3/uL (ref 150–400)
RBC: 4.88 MIL/uL (ref 3.87–5.11)
RDW: 13 % (ref 11.5–15.5)
WBC: 11.3 10*3/uL — ABNORMAL HIGH (ref 4.0–10.5)
nRBC: 0 % (ref 0.0–0.2)

## 2019-06-19 LAB — BASIC METABOLIC PANEL
Anion gap: 11 (ref 5–15)
BUN: 9 mg/dL (ref 6–20)
CO2: 23 mmol/L (ref 22–32)
Calcium: 8.6 mg/dL — ABNORMAL LOW (ref 8.9–10.3)
Chloride: 108 mmol/L (ref 98–111)
Creatinine, Ser: 0.6 mg/dL (ref 0.44–1.00)
GFR calc Af Amer: >60 mL/min (ref >60)
GFR calc non Af Amer: >60 mL/min (ref >60)
Glucose, Bld: 100 mg/dL — ABNORMAL HIGH (ref 70–99)
Potassium: 3.4 mmol/L — ABNORMAL LOW (ref 3.5–5.1)
Sodium: 142 mmol/L (ref 135–145)

## 2019-06-19 LAB — LIPASE, BLOOD: Lipase: 20 U/L (ref 11–51)

## 2019-06-19 LAB — TROPONIN I (HIGH SENSITIVITY): Troponin I (High Sensitivity): 3 ng/L (ref <18)

## 2019-06-19 NOTE — ED Triage Notes (Signed)
Pt c/o abdominal pain x 1 hr, mid-epigastric pain. Pt denies n/v/d. Pt has PMH of ulcers, has no gallbladder.

## 2019-06-19 NOTE — ED Triage Notes (Signed)
Pt in via POV, reports sudden onset epigastric pain, denies N/V/D.  Tearful in triage.  Vitals WDL.

## 2019-06-20 ENCOUNTER — Emergency Department: Payer: Self-pay

## 2019-06-20 ENCOUNTER — Emergency Department
Admission: EM | Admit: 2019-06-20 | Discharge: 2019-06-20 | Disposition: A | Discharge status: Home / Self Care | Payer: Self-pay | Attending: Emergency Medicine | Admitting: Emergency Medicine

## 2019-06-20 DIAGNOSIS — R1013 Epigastric pain: Secondary | ICD-10-CM

## 2019-06-20 LAB — TROPONIN I (HIGH SENSITIVITY): Troponin I (High Sensitivity): 3 ng/L (ref <18)

## 2019-06-20 LAB — POCT PREGNANCY, URINE: Preg Test, Ur: NEGATIVE

## 2019-06-20 MED ORDER — IOHEXOL 300 MG/ML  SOLN
100.0000 mL | Freq: Once | INTRAMUSCULAR | Status: AC | PRN
  Administered 2019-06-20: 03:00:00 100 mL via INTRAVENOUS
  Filled 2019-06-20: qty 100

## 2019-06-20 MED ORDER — ONDANSETRON HCL 4 MG/2ML IJ SOLN
4.0000 mg | Freq: Once | INTRAMUSCULAR | Status: AC
  Administered 2019-06-20: 02:00:00 4 mg via INTRAVENOUS
  Filled 2019-06-20: qty 2

## 2019-06-20 MED ORDER — MORPHINE SULFATE (PF) 2 MG/ML IV SOLN
2.0000 mg | Freq: Once | INTRAVENOUS | Status: AC
  Administered 2019-06-20: 2 mg via INTRAVENOUS
  Filled 2019-06-20: qty 1

## 2019-06-20 NOTE — ED Notes (Signed)
Pt with c/o abdominal pain since yesterday. Denies fever or chills.

## 2019-06-20 NOTE — ED Provider Notes (Signed)
[facility] Emergency Department Provider Note  ____________________________________________   First MD Initiated Contact with Patient 06/20/19 [phone]     (approximate)  I have reviewed the triage vital signs and the nursing notes.   HISTORY  Chief Complaint Abdominal Pain    HPI Grace Evans is a 43 y.o. female with below list of previous medical conditions presents to the emergency department secondary to generalized abdominal pain which patient states began at 9:00 PM tonight.  Patient states pain is currently 10 out of 10.  Patient denies any nausea vomiting diarrhea constipation.  Patient denies any fever.  Patient denies any urinary symptoms.  Patient states last food ingestion was at 6 PM.        Past Medical History:  Diagnosis Date   GERD (gastroesophageal reflux disease)    Hyperlipidemia     Patient Active Problem List   Diagnosis Date Noted   Obesity, unspecified 05/08/2012   History of panic attacks 07/24/2006    Past Surgical History:  Procedure Laterality Date   CHOLECYSTECTOMY      Prior to Admission medications   Medication Sig Start Date End Date Taking? Authorizing Provider  albuterol (PROVENTIL HFA;VENTOLIN HFA) 108 (90 Base) MCG/ACT inhaler Inhale 2 puffs into the lungs every 4 (four) hours as needed for wheezing or shortness of breath. Patient not taking: Reported on 05/16/2019 06/11/18   Cuthriell, Delorise RoyalsJonathan D, PA-C  brompheniramine-pseudoephedrine-DM 30-2-10 MG/5ML syrup Take 10 mLs by mouth 4 (four) times daily as needed. Patient not taking: Reported on 05/16/2019 06/11/18   Cuthriell, Delorise RoyalsJonathan D, PA-C  cyclobenzaprine (FLEXERIL) 5 MG tablet Take 1 tablet (5 mg total) by mouth 3 (three) times daily as needed for up to 5 days. 06/18/19 06/23/19  Dionne BucySiadecki, Sebastian, MD  fluticasone (FLONASE) 50 MCG/ACT nasal spray Place 1 spray into both nostrils 2 (two) times daily. Patient not taking: Reported on 05/16/2019 09/15/17    Cuthriell, Delorise RoyalsJonathan D, PA-C  HYDROcodone-acetaminophen (NORCO/VICODIN) 5-325 MG tablet Take 1 tablet by mouth every 4 (four) hours as needed. Patient not taking: Reported on 05/16/2019 09/10/16   Minna AntisPaduchowski, Kevin, MD  magic mouthwash w/lidocaine SOLN Take 5 mLs by mouth 4 (four) times daily. Patient not taking: Reported on 05/16/2019 10/01/17   Cuthriell, Delorise RoyalsJonathan D, PA-C  naproxen (NAPROSYN) 500 MG tablet Take 1 tablet (500 mg total) by mouth 2 (two) times daily with a meal for 15 days. 06/18/19 07/03/19  Dionne BucySiadecki, Sebastian, MD  predniSONE (DELTASONE) 50 MG tablet Take 1 tablet (50 mg total) by mouth daily with breakfast. Patient not taking: Reported on 05/16/2019 06/11/18   Cuthriell, Delorise RoyalsJonathan D, PA-C    Allergies Patient has no known allergies.  Family History  Problem Relation Age of Onset   Thyroid cancer Mother    Heart attack Mother    Congestive Heart Failure Father    Heart attack Father    Hyperlipidemia Father    Seizures Son    Diabetes Maternal Grandmother    Stroke Maternal Grandmother    Stroke Maternal Grandfather     Social History Social History   Tobacco Use   Smoking status: Current Every Day Smoker    Packs/day: 1.00    Types: Cigarettes   Smokeless tobacco: Never Used  Substance Use Topics   Alcohol use: No   Drug use: No    Review of Systems Constitutional: No fever/chills Eyes: No visual changes. ENT: No sore throat. Cardiovascular: Denies chest pain. Respiratory: Denies shortness of breath. Gastrointestinal: Positive for  abdominal pain.  No nausea, no vomiting.  No diarrhea.  No constipation. Genitourinary: Negative for dysuria. Musculoskeletal: Negative for neck pain.  Negative for back pain. Integumentary: Negative for rash. Neurological: Negative for headaches, focal weakness or numbness.  ____________________________________________   PHYSICAL EXAM:  VITAL SIGNS: ED Triage Vitals [06/19/19 2102]  Enc Vitals Group     BP  (!) 132/93     Pulse Rate 84     Resp 19     Temp 98.4 F (36.9 C)     Temp Source Oral     SpO2 100 %     Weight 98.4 kg (217 lb)     Height 1.676 m (5\' 6" )     Head Circumference      Peak Flow      Pain Score 10     Pain Loc      Pain Edu?      Excl. in [place]?     Constitutional: Alert and oriented.  Eyes: Conjunctivae are normal.  Mouth/Throat: Patient is wearing a mask. Neck: No stridor.  No meningeal signs.   Cardiovascular: Normal rate, regular rhythm. Good peripheral circulation. Grossly normal heart sounds. Respiratory: Normal respiratory effort.  No retractions. Gastrointestinal: Generalized tenderness to palpation.  No distention.  Musculoskeletal: No lower extremity tenderness nor edema. No gross deformities of extremities. Neurologic:  Normal speech and language. No gross focal neurologic deficits are appreciated.  Skin:  Skin is warm, dry and intact. Psychiatric: Mood and affect are normal. Speech and behavior are normal.  ____________________________________________   LABS (all labs ordered are listed, but only abnormal results are displayed)  Labs Reviewed  BASIC METABOLIC PANEL - Abnormal; Notable for the following components:      Result Value   Potassium 3.4 (*)    Glucose, Bld 100 (*)    Calcium 8.6 (*)    All other components within normal limits  CBC - Abnormal; Notable for the following components:   WBC 11.3 (*)    All other components within normal limits  LIPASE, BLOOD  POC URINE PREG, ED  POCT PREGNANCY, URINE  TROPONIN I (HIGH SENSITIVITY)  TROPONIN I (HIGH SENSITIVITY)   ____________________________________________  EKG ED ECG REPORT I, [place] N Kelby Adell, the attending physician, personally viewed and interpreted this ECG.   Date: 06/20/2019  EKG Time: 9:10 PM  Rate: 83  Rhythm: Normal sinus rhythm  Axis: Normal  Intervals: Normal  ST&T Change: None  ____________________________________________  RADIOLOGY I, [place] N Martiza Speth,  personally viewed and evaluated these images (plain radiographs) as part of my medical decision making, as well as reviewing the written report by the radiologist.  ED MD interpretation: No acute intra-abdominal or pelvic pathology noted on CT per radiologist.  Official radiology report(s): Ct Abdomen Pelvis W Contrast  Result Date: 06/20/2019 CLINICAL DATA:  Acute mid epigastric pain EXAM: CT ABDOMEN AND PELVIS WITH CONTRAST TECHNIQUE: Multidetector CT imaging of the abdomen and pelvis was performed using the standard protocol following bolus administration of intravenous contrast. CONTRAST:  06/22/2019 OMNIPAQUE IOHEXOL 300 MG/ML  SOLN COMPARISON:  March 17, 2017 FINDINGS: Lower chest: The visualized heart size within normal limits. No pericardial fluid/thickening. No hiatal hernia. The visualized portions of the lungs are clear. Hepatobiliary: The liver is normal in density without focal abnormality.The main portal vein is patent. The patient is status post cholecystectomy. No biliary ductal dilation. Pancreas: Unremarkable. No pancreatic ductal dilatation or surrounding inflammatory changes. Spleen: Normal in size without focal abnormality. Adrenals/Urinary Tract: Both  adrenal glands appear normal. The kidneys and collecting system appear normal without evidence of urinary tract calculus or hydronephrosis. Bladder is unremarkable. Stomach/Bowel: The stomach, small bowel, and colon are normal in appearance. No inflammatory changes, wall thickening, or obstructive findings.The appendix is normal. Vascular/Lymphatic: There are no enlarged mesenteric, retroperitoneal, or pelvic lymph nodes. No significant vascular findings are present. Reproductive: The uterus and adnexa are unremarkable. Other: Again noted within the left inguinal region is a hyperdense 5.3 cm mass as on the prior exam. Musculoskeletal: No acute or significant osseous findings. IMPRESSION: No acute intra-abdominal or pelvic pathology to explain  the patient's symptoms. left inguinal region mass, which was present dating back to 2016, however slightly enlarged since 2018. This could represent a complex sebaceous cyst. If further evaluation is required, would recommend ultrasound. Electronically Signed   By: Prudencio Pair M.D.   On: 06/20/2019 02:55      Procedures   ____________________________________________   INITIAL IMPRESSION / MDM / [place] / ED COURSE  As part of my medical decision making, I reviewed the following data within the electronic MEDICAL RECORD NUMBER   43 year old female presented with above-stated history and physical exam secondary to generalized abdominal pain.  Considered possibly appendicitis diverticulitis ulcer disease versus other potential intra-abdominal pathology and as such CT scan of the abdomen pelvis was performed.  CT revealed no acute intra-abdominal or pelvic pathology.  Patient will be referred to gastroenterology for further outpatient evaluation and management  ____________________________________________  FINAL CLINICAL IMPRESSION(S) / ED DIAGNOSES  Final diagnoses:  Epigastric pain     MEDICATIONS GIVEN DURING THIS VISIT:  Medications  morphine 2 MG/ML injection 2 mg (2 mg Intravenous Given 06/20/19 0150)  ondansetron (ZOFRAN) injection 4 mg (4 mg Intravenous Given 06/20/19 0149)  iohexol (OMNIPAQUE) 300 MG/ML solution 100 mL (100 mLs Intravenous Contrast Given 06/20/19 0238)     ED Discharge Orders    None      *Please note:  Grace Evans was evaluated in Emergency Department on 06/20/2019 for the symptoms described in the history of present illness. She was evaluated in the context of the global COVID-19 pandemic, which necessitated consideration that the patient might be at risk for infection with the SARS-CoV-2 virus that causes COVID-19. Institutional protocols and algorithms that pertain to the evaluation of patients at risk for COVID-19 are in a state of rapid  change based on information released by regulatory bodies including the CDC and federal and state organizations. These policies and algorithms were followed during the patient's care in the ED.  Some ED evaluations and interventions may be delayed as a result of limited staffing during the pandemic.*  Note:  This document was prepared using Dragon voice recognition software and may include unintentional dictation errors.   Gregor Hams, MD 06/20/19 [phone]

## 2019-06-20 NOTE — ED Notes (Signed)
Patient transported to CT 

## 2019-07-22 ENCOUNTER — Encounter: Payer: Self-pay | Admitting: Family Medicine

## 2019-07-30 ENCOUNTER — Ambulatory Visit (LOCAL_COMMUNITY_HEALTH_CENTER): Payer: Medicaid Other

## 2019-07-30 ENCOUNTER — Other Ambulatory Visit: Payer: Self-pay

## 2019-07-30 VITALS — BP 112/82 | Ht 65.0 in | Wt 218.5 lb

## 2019-07-30 DIAGNOSIS — Z3009 Encounter for other general counseling and advice on contraception: Secondary | ICD-10-CM | POA: Diagnosis not present

## 2019-07-30 DIAGNOSIS — Z30013 Encounter for initial prescription of injectable contraceptive: Secondary | ICD-10-CM | POA: Diagnosis not present

## 2019-07-30 NOTE — Progress Notes (Signed)
Pt here for Depo. Pt denies any issues with Depo and is satisfied with it as her birth control method. Pt's last Depo was 05/16/2019, so pt is 10 weeks and 5 days post last Depo. Per visit on 05/16/2019 Antoine Primas, PA wrote order for Depo 150mg  IM every 11-13 weeks x1 year. Cosulted with Antoine Primas, PA regarding pt only being 10 weeks and 5 days post last Depo, and per Antoine Primas, PA ok for pt to receive Depo today. Pt received Depo 150mg  IM today per Antoine Primas, PA verbal order and order from 05/16/2019. Pt tolerated well.Ronny Bacon, RN

## 2019-07-30 NOTE — Progress Notes (Signed)
Consulted by RN re:  patient < 11 wk since last Depo.  Verbal order given to give Depo today at 10wk 5 days.

## 2019-08-22 ENCOUNTER — Other Ambulatory Visit: Payer: Self-pay

## 2019-08-22 ENCOUNTER — Emergency Department
Admission: EM | Admit: 2019-08-22 | Discharge: 2019-08-22 | Disposition: A | Discharge status: Home / Self Care | Payer: Medicaid Other | Attending: Emergency Medicine | Admitting: Emergency Medicine

## 2019-08-22 ENCOUNTER — Encounter: Payer: Self-pay | Admitting: Emergency Medicine

## 2019-08-22 DIAGNOSIS — Z79899 Other long term (current) drug therapy: Secondary | ICD-10-CM | POA: Insufficient documentation

## 2019-08-22 DIAGNOSIS — Z20822 Contact with and (suspected) exposure to covid-19: Secondary | ICD-10-CM | POA: Insufficient documentation

## 2019-08-22 DIAGNOSIS — J029 Acute pharyngitis, unspecified: Secondary | ICD-10-CM | POA: Insufficient documentation

## 2019-08-22 DIAGNOSIS — M545 Low back pain: Secondary | ICD-10-CM | POA: Insufficient documentation

## 2019-08-22 DIAGNOSIS — R300 Dysuria: Secondary | ICD-10-CM | POA: Insufficient documentation

## 2019-08-22 DIAGNOSIS — F17210 Nicotine dependence, cigarettes, uncomplicated: Secondary | ICD-10-CM | POA: Insufficient documentation

## 2019-08-22 DIAGNOSIS — R350 Frequency of micturition: Secondary | ICD-10-CM | POA: Insufficient documentation

## 2019-08-22 DIAGNOSIS — E785 Hyperlipidemia, unspecified: Secondary | ICD-10-CM | POA: Insufficient documentation

## 2019-08-22 LAB — URINALYSIS, COMPLETE (UACMP) WITH MICROSCOPIC
Bacteria, UA: NONE SEEN
Bilirubin Urine: NEGATIVE
Glucose, UA: NEGATIVE mg/dL
Hgb urine dipstick: NEGATIVE
Ketones, ur: NEGATIVE mg/dL
Leukocytes,Ua: NEGATIVE
Nitrite: NEGATIVE
Protein, ur: 30 mg/dL — AB
Specific Gravity, Urine: 1.04 — ABNORMAL HIGH (ref 1.005–1.030)
pH: 6 (ref 5.0–8.0)

## 2019-08-22 MED ORDER — LIDOCAINE VISCOUS HCL 2 % MT SOLN: 15.0000 mL | OROMUCOSAL | 0 refills | Status: DC | PRN

## 2019-08-22 MED ORDER — PHENAZOPYRIDINE HCL 200 MG PO TABS
200.0000 mg | ORAL_TABLET | Freq: Once | ORAL | Status: AC
  Administered 2019-08-22: 200 mg via ORAL
  Filled 2019-08-22: qty 1

## 2019-08-22 MED ORDER — PHENAZOPYRIDINE HCL 200 MG PO TABS: 200.0000 mg | ORAL_TABLET | Freq: Three times a day (TID) | ORAL | 0 refills | Status: DC | PRN

## 2019-08-22 MED ORDER — LIDOCAINE VISCOUS HCL 2 % MT SOLN
15.0000 mL | Freq: Once | OROMUCOSAL | Status: AC
  Administered 2019-08-22: 15 mL via OROMUCOSAL
  Filled 2019-08-22: qty 15

## 2019-08-22 NOTE — ED Triage Notes (Signed)
Pt to ED via POV c/o sore throat and dysuria x 2 days. Pt is in NAD/

## 2019-08-22 NOTE — ED Provider Notes (Signed)
[facility] Emergency Department Provider Note ____________________________________________   First MD Initiated Contact with Patient 08/22/19 2053     (approximate)  I have reviewed the triage vital signs and the nursing notes.   HISTORY  Chief Complaint Sore Throat and Dysuria  HPI Grace Evans is a 44 y.o. female presents to the emergency department for treatment and evaluation of dysuria and sore throat for the past 2 days.  She is also experiencing some bilateral lower back pain.  She denies fever, chills, nausea, vomiting, diarrhea.  She denies exposure to anyone else that has ill to include exposure to COVID-19.  No alleviating measures attempted prior to arrival.         Past Medical History:  Diagnosis Date  . GERD (gastroesophageal reflux disease)   . Hyperlipidemia     Patient Active Problem List   Diagnosis Date Noted  . Obesity, unspecified 05/08/2012  . History of panic attacks 07/24/2006    Past Surgical History:  Procedure Laterality Date  . CHOLECYSTECTOMY      Prior to Admission medications   Medication Sig Start Date End Date Taking? Authorizing Provider  albuterol (PROVENTIL HFA;VENTOLIN HFA) 108 (90 Base) MCG/ACT inhaler Inhale 2 puffs into the lungs every 4 (four) hours as needed for wheezing or shortness of breath. Patient not taking: Reported on 05/16/2019 06/11/18   Cuthriell, Charline Bills, PA-C  brompheniramine-pseudoephedrine-DM 30-2-10 MG/5ML syrup Take 10 mLs by mouth 4 (four) times daily as needed. Patient not taking: Reported on 05/16/2019 06/11/18   Cuthriell, Charline Bills, PA-C  fluticasone (FLONASE) 50 MCG/ACT nasal spray Place 1 spray into both nostrils 2 (two) times daily. Patient not taking: Reported on 05/16/2019 09/15/17   Cuthriell, Charline Bills, PA-C  HYDROcodone-acetaminophen (NORCO/VICODIN) 5-325 MG tablet Take 1 tablet by mouth every 4 (four) hours as needed. Patient not taking: Reported on 05/16/2019 09/10/16    Harvest Dark, MD  lidocaine (XYLOCAINE) 2 % solution Use as directed 15 mLs in the mouth or throat as needed for mouth pain. 08/22/19   [name], [name], FNP  magic mouthwash w/lidocaine SOLN Take 5 mLs by mouth 4 (four) times daily. Patient not taking: Reported on 05/16/2019 10/01/17   Cuthriell, Charline Bills, PA-C  ondansetron (ZOFRAN) 4 MG tablet Take 4 mg by mouth every 8 (eight) hours as needed for nausea or vomiting.    [provider]  phenazopyridine (PYRIDIUM) 200 MG tablet Take 1 tablet (200 mg total) by mouth 3 (three) times daily as needed for pain. 08/22/19   Jordanna Hendrie, Dessa Phi, FNP  predniSONE (DELTASONE) 50 MG tablet Take 1 tablet (50 mg total) by mouth daily with breakfast. Patient not taking: Reported on 05/16/2019 06/11/18   Cuthriell, Charline Bills, PA-C    Allergies Patient has no known allergies.  Family History  Problem Relation Age of Onset  . Thyroid cancer Mother   . Heart attack Mother   . Congestive Heart Failure Father   . Heart attack Father   . Hyperlipidemia Father   . Seizures Son   . Diabetes Maternal Grandmother   . Stroke Maternal Grandmother   . Stroke Maternal Grandfather     Social History Social History   Tobacco Use  . Smoking status: Current Every Day Smoker    Packs/day: 1.00    Types: Cigarettes  . Smokeless tobacco: Never Used  Substance Use Topics  . Alcohol use: No  . Drug use: No    Review of Systems  Constitutional: No fever/chills  Eyes: No visual changes. ENT: Positive sore throat. Cardiovascular: Denies chest pain. Respiratory: Denies shortness of breath. Gastrointestinal: No abdominal pain.  No nausea, no vomiting.  No diarrhea.  No constipation. Genitourinary: Positive for dysuria and urinary frequency. Musculoskeletal: Positive for back pain. Skin: Negative for rash. Neurological: Negative for headaches, focal weakness or numbness. ____________________________________________   PHYSICAL EXAM:  VITAL  SIGNS: ED Triage Vitals  Enc Vitals Group     BP 08/22/19 1938 106/67     Pulse Rate 08/22/19 1938 83     Resp 08/22/19 1938 16     Temp 08/22/19 1938 98.1 F (36.7 C)     Temp Source 08/22/19 1938 Oral     SpO2 08/22/19 1938 97 %     Weight 08/22/19 1939 219 lb (99.3 kg)     Height 08/22/19 1939 5\' 3"  (1.6 m)     Head Circumference --      Peak Flow --      Pain Score 08/22/19 1939 5     Pain Loc --      Pain Edu? --      Excl. in GC? --     Constitutional: Alert and oriented. Well appearing and in no acute distress. Eyes: Conjunctivae are normal. PERRL. EOMI. Head: Atraumatic. Nose: No congestion/rhinnorhea. Mouth/Throat: Mucous membranes are moist.  Oropharynx non-erythematous. Neck: No stridor.   Hematological/Lymphatic/Immunilogical: No cervical lymphadenopathy. Cardiovascular: Normal rate, regular rhythm. Grossly normal heart sounds.  Good peripheral circulation. Respiratory: Normal respiratory effort.  No retractions. Lungs CTAB. Gastrointestinal: Soft and nontender. No distention. No abdominal bruits. No CVA tenderness. Genitourinary:  Musculoskeletal: No lower extremity tenderness nor edema.  No joint effusions. Neurologic:  Normal speech and language. No gross focal neurologic deficits are appreciated. No gait instability. Skin:  Skin is warm, dry and intact. No rash noted. Psychiatric: Mood and affect are normal. Speech and behavior are normal.  ____________________________________________   LABS (all labs ordered are listed, but only abnormal results are displayed)  Labs Reviewed  URINALYSIS, COMPLETE (UACMP) WITH MICROSCOPIC - Abnormal; Notable for the following components:      Result Value   Color, Urine YELLOW (*)    APPearance CLEAR (*)    Specific Gravity, Urine 1.040 (*)    Protein, ur 30 (*)    All other components within normal limits  URINE CULTURE  SARS CORONAVIRUS 2 (TAT 6-24 HRS)   ____________________________________________  EKG  Not  indicated ____________________________________________  RADIOLOGY  ED MD interpretation:    Not indicated  Official radiology report(s): No results found.  ____________________________________________   PROCEDURES  Procedure(s) performed (including Critical Care):  Procedures  ____________________________________________   INITIAL IMPRESSION / ASSESSMENT AND PLAN     44 year old female presenting to the emergency department for treatment and evaluation of sore throat and dysuria.  This started about 2 days ago.  No alleviating measures attempted prior to arrival.  See HPI for further details.  Plan will be to test her for COVID-19 as well as collect a urine for acute cystitis.  DIFFERENTIAL DIAGNOSIS  COVID-19, acute viral pharyngitis, dysuria, hematuria, pyelonephritis, acute cystitis  ED COURSE  Urinalysis is reassuring.  Based on patient's symptoms, urine culture has been requested and she will be treated in the meantime with Pyridium.  Her symptoms are not completely consistent with COVID-19, however the patient is concerned because she lives with her elderly parents who both have COPD.  She wishes to have a COVID-19 screening because of her sore throat.  This  will be completed and sent out prior to her discharge.  She will be given some viscous lidocaine for the pharyngitis and advised to take Tylenol or ibuprofen in addition if needed.  She is to return to the emergency department for symptoms that change or worsen if she is unable to schedule appointment with her primary care provider. ____________________________________________   FINAL CLINICAL IMPRESSION(S) / ED DIAGNOSES  Final diagnoses:  Viral pharyngitis  Urinary frequency  Dysuria     ED Discharge Orders         Ordered    phenazopyridine (PYRIDIUM) 200 MG tablet  3 times daily PRN     08/22/19 2215    lidocaine (XYLOCAINE) 2 % solution  As needed     08/22/19 2215           Note:  This  document was prepared using Dragon voice recognition software and may include unintentional dictation errors.   Chinita Pester, FNP 08/22/19 2246    Chesley Noon, MD 08/25/19 2039

## 2019-08-23 LAB — SARS CORONAVIRUS 2 (TAT 6-24 HRS): SARS Coronavirus 2: NEGATIVE

## 2019-08-24 LAB — URINE CULTURE

## 2019-09-02 ENCOUNTER — Ambulatory Visit: Payer: Medicaid Other | Attending: Internal Medicine

## 2019-09-02 DIAGNOSIS — Z20822 Contact with and (suspected) exposure to covid-19: Secondary | ICD-10-CM

## 2019-09-03 LAB — NOVEL CORONAVIRUS, NAA: SARS-CoV-2, NAA: NOT DETECTED

## 2019-09-04 ENCOUNTER — Telehealth: Payer: Self-pay | Admitting: General Practice

## 2019-09-04 NOTE — Telephone Encounter (Signed)
Negative COVID results given. Patient results "NOT Detected." Caller expressed understanding. ° °

## 2019-09-16 ENCOUNTER — Ambulatory Visit: Payer: Medicaid Other | Admitting: Gastroenterology

## 2019-09-17 ENCOUNTER — Ambulatory Visit: Payer: Medicaid Other | Admitting: Gastroenterology

## 2019-10-24 ENCOUNTER — Emergency Department
Admission: EM | Admit: 2019-10-24 | Discharge: 2019-10-24 | Disposition: A | Discharge status: Home / Self Care | Payer: Medicaid Other | Attending: Emergency Medicine | Admitting: Emergency Medicine

## 2019-10-24 ENCOUNTER — Other Ambulatory Visit: Payer: Self-pay

## 2019-10-24 DIAGNOSIS — K645 Perianal venous thrombosis: Secondary | ICD-10-CM | POA: Insufficient documentation

## 2019-10-24 DIAGNOSIS — K649 Unspecified hemorrhoids: Secondary | ICD-10-CM

## 2019-10-24 MED ORDER — HYDROCODONE-ACETAMINOPHEN 5-325 MG PO TABS: 1.0000 | ORAL_TABLET | Freq: Three times a day (TID) | ORAL | 0 refills | Status: AC | PRN

## 2019-10-24 MED ORDER — HYDROCORTISONE ACETATE 25 MG RE SUPP: 25.0000 mg | Freq: Two times a day (BID) | RECTAL | 0 refills | Status: AC

## 2019-10-24 MED ORDER — HYDROCODONE-ACETAMINOPHEN 5-325 MG PO TABS: 1.0000 | ORAL_TABLET | Freq: Three times a day (TID) | ORAL | 0 refills | Status: DC | PRN

## 2019-10-24 MED ORDER — DIBUCAINE (PERIANAL) 1 % EX OINT: 1.0000 "application " | TOPICAL_OINTMENT | Freq: Three times a day (TID) | CUTANEOUS | 0 refills | Status: DC | PRN

## 2019-10-24 MED ORDER — HYDROCORTISONE ACETATE 25 MG RE SUPP
25.0000 mg | Freq: Once | RECTAL | Status: AC
  Administered 2019-10-24: 25 mg via RECTAL
  Filled 2019-10-24: qty 1

## 2019-10-24 MED ORDER — LIDOCAINE-EPINEPHRINE 2 %-1:100000 IJ SOLN
1.7000 mL | Freq: Once | INTRAMUSCULAR | Status: AC
  Administered 2019-10-24: 1.7 mL
  Filled 2019-10-24: qty 1.7

## 2019-10-24 MED ORDER — HYDROCODONE-ACETAMINOPHEN 5-325 MG PO TABS
1.0000 | ORAL_TABLET | Freq: Once | ORAL | Status: AC
  Administered 2019-10-24: 13:00:00 1 via ORAL
  Filled 2019-10-24: qty 1

## 2019-10-24 MED ORDER — HYDROCORTISONE ACETATE 25 MG RE SUPP: 25.0000 mg | Freq: Two times a day (BID) | RECTAL | 0 refills | Status: DC

## 2019-10-24 NOTE — ED Provider Notes (Signed)
[facility] Emergency Department Provider Note ____________________________________________  Time seen: 1217  I have reviewed the triage vital signs and the nursing notes.  HISTORY  Chief Complaint  hemorroids  HPI Grace Evans is a 44 y.o. female presents herself to the ED for evaluation of rectal pain and bleeding. She has a history of hemorrhoids, and noted the onset of rectal pain and fullness 3 days ago. Over the last day, she began to experience BRBTP. She notes loose stools due to cholecystectomy. She denies fevers, chills, sweats, or hematochezia. She has been using OTC Preparation H over the last few days.   Past Medical History:  Diagnosis Date  . GERD (gastroesophageal reflux disease)   . Hyperlipidemia     Patient Active Problem List   Diagnosis Date Noted  . Obesity, unspecified 05/08/2012  . History of panic attacks 07/24/2006    Past Surgical History:  Procedure Laterality Date  . CHOLECYSTECTOMY      Prior to Admission medications   Medication Sig Start Date End Date Taking? Authorizing Provider  dibucaine (NUPERCAINAL) 1 % OINT Place 1 application rectally 3 (three) times daily as needed. 10/24/19   Diezel Mazur, Dannielle Karvonen, PA-C  HYDROcodone-acetaminophen (NORCO) 5-325 MG tablet Take 1 tablet by mouth 3 (three) times daily as needed for up to 3 days. 10/24/19 10/27/19  Letishia Elliott, Dannielle Karvonen, PA-C  hydrocortisone (ANUSOL-HC) 25 MG suppository Place 1 suppository (25 mg total) rectally every 12 (twelve) hours for 6 days. 10/24/19 10/30/19  Ceil Roderick, Dannielle Karvonen, PA-C  lidocaine (XYLOCAINE) 2 % solution Use as directed 15 mLs in the mouth or throat as needed for mouth pain. 08/22/19   Triplett, Cari B, FNP  ondansetron (ZOFRAN) 4 MG tablet Take 4 mg by mouth every 8 (eight) hours as needed for nausea or vomiting.    [provider]  phenazopyridine (PYRIDIUM) 200 MG tablet Take 1 tablet (200 mg total) by mouth 3 (three) times daily  as needed for pain. 08/22/19   Triplett, [name], FNP  albuterol (PROVENTIL HFA;VENTOLIN HFA) 108 (90 Base) MCG/ACT inhaler Inhale 2 puffs into the lungs every 4 (four) hours as needed for wheezing or shortness of breath. Patient not taking: Reported on 05/16/2019 06/11/18 10/24/19  Cuthriell, Charline Bills, PA-C  fluticasone (FLONASE) 50 MCG/ACT nasal spray Place 1 spray into both nostrils 2 (two) times daily. Patient not taking: Reported on 05/16/2019 09/15/17 10/24/19  Cuthriell, Charline Bills, PA-C    Allergies Patient has no known allergies.  Family History  Problem Relation Age of Onset  . Thyroid cancer Mother   . Heart attack Mother   . Congestive Heart Failure Father   . Heart attack Father   . Hyperlipidemia Father   . Seizures Son   . Diabetes Maternal Grandmother   . Stroke Maternal Grandmother   . Stroke Maternal Grandfather     Social History Social History   Tobacco Use  . Smoking status: Current Every Day Smoker    Packs/day: 1.00    Types: Cigarettes  . Smokeless tobacco: Never Used  Substance Use Topics  . Alcohol use: No  . Drug use: No    Review of Systems  Constitutional: Negative for fever. Cardiovascular: Negative for chest pain. Respiratory: Negative for shortness of breath. Gastrointestinal: Negative for abdominal pain, vomiting and diarrhea. Genitourinary: Negative for dysuria. Rectal pain and bleeding as above.  Musculoskeletal: Negative for back pain. Skin: Negative for rash. Neurological: Negative for headaches, focal weakness or numbness. ____________________________________________  PHYSICAL EXAM:  VITAL SIGNS: ED Triage Vitals  Enc Vitals Group     BP 10/24/19 1141 136/82     Pulse Rate 10/24/19 1141 87     Resp 10/24/19 1141 18     Temp 10/24/19 1141 98.3 F (36.8 C)     Temp src --      SpO2 10/24/19 1141 98 %     Weight 10/24/19 1142 218 lb (98.9 kg)     Height 10/24/19 1142 5\' 6"  (1.676 m)     Head Circumference --      Peak Flow --       Pain Score 10/24/19 1142 7     Pain Loc --      Pain Edu? --      Excl. in GC? --     Constitutional: Alert and oriented. Well appearing and in no distress. Head: Normocephalic and atraumatic. Eyes: Conjunctivae are normal. Normal extraocular movements Cardiovascular: Normal rate, regular rhythm. Normal distal pulses. Respiratory: Normal respiratory effort. No wheezes/rales/rhonchi. Gastrointestinal: rectal exam reveals a single, thrombosed external hemorrhoid at the 7 o'clock position. There is a blood blister with some minimal blood drainage noted. Digital rectal exam notes some extension of edema and firmness into the canal.  Musculoskeletal: Nontender with normal range of motion in all extremities.  Neurologic:  Normal gait without ataxia. Normal speech and language. No gross focal neurologic deficits are appreciated. Skin:  Skin is warm, dry and intact. No rash noted. ____________________________________________  PROCEDURES  Anusol suppository Norco 5-325 mg PO  .03/21/21Incision and Drainage  Date/Time: 10/24/2019 1:30 PM Performed by: 10/26/2019, PA-C Authorized by: Lissa Hoard, PA-C   Consent:    Consent obtained:  Verbal   Consent given by:  Patient   Risks discussed:  Bleeding and pain   Alternatives discussed:  Alternative treatment Location:    Type:  External thrombosed hemorrhoid   Location:  Anogenital   Anogenital location:  Rectum Anesthesia (see MAR for exact dosages):    Anesthesia method:  Local infiltration   Local anesthetic:  Lidocaine 2% WITH epi Procedure type:    Complexity:  Simple Procedure details:    Needle aspiration: no     Incision types:  Single straight   Incision depth:  Subcutaneous   Scalpel blade:  11   Wound management:  Irrigated with saline   Wound treatment:  Wound left open   Packing materials:  None Post-procedure details:    Patient tolerance of procedure:  Tolerated well, no immediate  complications Comments:     Evacuated 3 small, clots from the hemorrhoid    ____________________________________________  INITIAL IMPRESSION / ASSESSMENT AND PLAN / ED COURSE  Patient presents to the ED for evaluation of bright red blood in toilet paper and a history of hemorrhoids.  She describes over the last 2 to 3 days she has had development of a focal external hemorrhoid to the rectum.  Over the last day however, she is experience some bright red blood with wiping as well as some spontaneous bleeding.  She denies any other symptoms at this time.  Exam reveals a thrombosed external hemorrhoid on exam.  The hemorrhoid appears to have some area of irritation where it is spontaneously draining.  She was agreeable to a local I&D and evacuation procedure.  The procedure was successful clots were removed from the thrombosed hemorrhoid and patient reports improvement of her symptoms but she discharged with dibucaine, Anusol suppositories, and small prescription of  hydrocodone.  She will follow up with GI for ongoing symptoms. Return precautions have been reviewed.  Grace Evans was evaluated in Emergency Department on 10/24/2019 for the symptoms described in the history of present illness. She was evaluated in the context of the global COVID-19 pandemic, which necessitated consideration that the patient might be at risk for infection with the SARS-CoV-2 virus that causes COVID-19. Institutional protocols and algorithms that pertain to the evaluation of patients at risk for COVID-19 are in a state of rapid change based on information released by regulatory bodies including the CDC and federal and state organizations. These policies and algorithms were followed during the patient's care in the ED.  I reviewed the patient's prescription history over the last 12 months in the multi-state controlled substances database(s) that includes [place], [place], [place], [place], [place], [place], [place],  [place], [place], [place], [place], [place], [place], and [place].  Results were notable for no RX history.  ____________________________________________  FINAL CLINICAL IMPRESSION(S) / ED DIAGNOSES  Final diagnoses:  Hemorrhoids, unspecified hemorrhoid type  Thrombosed external hemorrhoid      Karmen Stabs, Charlesetta Ivory, PA-C 10/24/19 1612    Shaune Pollack, MD 10/24/19 1752

## 2019-10-24 NOTE — Discharge Instructions (Addendum)
You have had a thrombosed hemorrhoid evacuated in the ED. Keep the area clean and dry. Take the pain medicine as needed. Use the topical cream along with warm-water sitz baths to promote healing. Follow-up with GI Medicine as needed.

## 2019-10-24 NOTE — ED Triage Notes (Signed)
Pt comes via pOV from home with c/o hemorrhoids that occurred 3 days ago. Pt states she thinks they busted. Pt states pain and bleeding from the site.

## 2020-01-07 ENCOUNTER — Encounter (HOSPITAL_COMMUNITY): Payer: Self-pay | Admitting: Emergency Medicine

## 2020-01-07 ENCOUNTER — Other Ambulatory Visit: Payer: Self-pay

## 2020-01-07 ENCOUNTER — Emergency Department (HOSPITAL_COMMUNITY)
Admission: EM | Admit: 2020-01-07 | Discharge: 2020-01-07 | Disposition: A | Discharge status: Home / Self Care | Payer: Self-pay | Attending: Emergency Medicine | Admitting: Emergency Medicine

## 2020-01-07 ENCOUNTER — Emergency Department (HOSPITAL_COMMUNITY): Payer: Self-pay

## 2020-01-07 DIAGNOSIS — M545 Low back pain, unspecified: Secondary | ICD-10-CM

## 2020-01-07 DIAGNOSIS — F17210 Nicotine dependence, cigarettes, uncomplicated: Secondary | ICD-10-CM | POA: Insufficient documentation

## 2020-01-07 LAB — POC URINE PREG, ED: Preg Test, Ur: NEGATIVE

## 2020-01-07 MED ORDER — PREDNISONE 20 MG PO TABS: 40.0000 mg | ORAL_TABLET | Freq: Every day | ORAL | 0 refills | Status: AC

## 2020-01-07 MED ORDER — KETOROLAC TROMETHAMINE 30 MG/ML IJ SOLN
30.0000 mg | Freq: Once | INTRAMUSCULAR | Status: AC
  Administered 2020-01-07: 30 mg via INTRAMUSCULAR
  Filled 2020-01-07: qty 1

## 2020-01-07 MED ORDER — METHOCARBAMOL 500 MG PO TABS: 500.0000 mg | ORAL_TABLET | Freq: Two times a day (BID) | ORAL | 0 refills | Status: DC

## 2020-01-07 NOTE — ED Provider Notes (Signed)
[facility] EMERGENCY DEPARTMENT Provider Note   CSN: [id number] Arrival date & time: 01/07/20  1302     History Chief Complaint  Patient presents with  . Back Pain    Grace Evans is a 44 y.o. female past medical history of GERD, hyperlipidemia who presents for evaluation of lower back pain x1 week ago.  Patient reports it is the midline lower lumbar region that hurts.  She states it is worse with movement, bending or when she tries to get up and walk around. Her pain does not radiate.  She does report that she recently has been sitting a lot as her father has been in the hospital.  She denies any preceding trauma, injury, fall.  No recent heavy lifting that she recalls.  She has been taking Tylenol, once in the morning and once in the evening with minimal improvement in her symptoms. Denies fevers, weight loss, numbness/weakness of upper and lower extremities, bowel/bladder incontinence, saddle anesthesia, history of back surgery, history of IVDA.  She denies any abdominal pain, urinary complaints.  The history is provided by the patient.       Past Medical History:  Diagnosis Date  . GERD (gastroesophageal reflux disease)   . Hyperlipidemia     Patient Active Problem List   Diagnosis Date Noted  . Obesity, unspecified 05/08/2012  . History of panic attacks 07/24/2006    Past Surgical History:  Procedure Laterality Date  . CHOLECYSTECTOMY       OB History    Gravida  2   Para      Term      Preterm      AB  1   Living  1     SAB      TAB      Ectopic      Multiple      Live Births              Family History  Problem Relation Age of Onset  . Thyroid cancer Mother   . Heart attack Mother   . Congestive Heart Failure Father   . Heart attack Father   . Hyperlipidemia Father   . Seizures Son   . Diabetes Maternal Grandmother   . Stroke Maternal Grandmother   . Stroke Maternal Grandfather     Social History   Tobacco  Use  . Smoking status: Current Every Day Smoker    Packs/day: 1.00    Types: Cigarettes  . Smokeless tobacco: Never Used  Substance Use Topics  . Alcohol use: No  . Drug use: No    Home Medications Prior to Admission medications   Medication Sig Start Date End Date Taking? Authorizing Provider  dibucaine (NUPERCAINAL) 1 % OINT Place 1 application rectally 3 (three) times daily as needed. 10/24/19   Menshew, Charlesetta Ivory, PA-C  lidocaine (XYLOCAINE) 2 % solution Use as directed 15 mLs in the mouth or throat as needed for mouth pain. 08/22/19   Triplett, Rulon Eisenmenger B, FNP  methocarbamol (ROBAXIN) 500 MG tablet Take 1 tablet (500 mg total) by mouth 2 (two) times daily. 01/07/20   Maxwell Caul, PA-C  ondansetron (ZOFRAN) 4 MG tablet Take 4 mg by mouth every 8 (eight) hours as needed for nausea or vomiting.    [provider]  phenazopyridine (PYRIDIUM) 200 MG tablet Take 1 tablet (200 mg total) by mouth 3 (three) times daily as needed for pain. 08/22/19   Chinita Pester, FNP  predniSONE (DELTASONE) 20 MG tablet Take 2 tablets (40 mg total) by mouth daily for 4 days. 01/07/20 01/11/20  Volanda Napoleon, PA-C  albuterol (PROVENTIL HFA;VENTOLIN HFA) 108 (90 Base) MCG/ACT inhaler Inhale 2 puffs into the lungs every 4 (four) hours as needed for wheezing or shortness of breath. Patient not taking: Reported on 05/16/2019 06/11/18 10/24/19  Cuthriell, Charline Bills, PA-C  fluticasone (FLONASE) 50 MCG/ACT nasal spray Place 1 spray into both nostrils 2 (two) times daily. Patient not taking: Reported on 05/16/2019 09/15/17 10/24/19  Cuthriell, Charline Bills, PA-C    Allergies    Patient has no known allergies.  Review of Systems   Review of Systems  Constitutional: Negative for fever.  Musculoskeletal: Positive for back pain. Negative for neck pain.  Neurological: Negative for weakness and numbness.  All other systems reviewed and are negative.   Physical Exam Updated Vital Signs BP 128/84   Pulse 79    Temp 97.8 F (36.6 C) (Oral)   Resp 16   SpO2 100%   Physical Exam Vitals and nursing note reviewed.  Constitutional:      Appearance: She is well-developed.  HENT:     Head: Normocephalic and atraumatic.  Eyes:     General: No scleral icterus.       Right eye: No discharge.        Left eye: No discharge.     Conjunctiva/sclera: Conjunctivae normal.  Neck:     Comments: Full flexion/extension and lateral movement of neck fully intact. No bony midline tenderness. No deformities or crepitus.  Pulmonary:     Effort: Pulmonary effort is normal.  Musculoskeletal:       Back:     Comments: No midline T spine tenderness. Tenderness noted to mid L spine tenderness. No deformities or crepitus. Normal gait.   Skin:    General: Skin is warm and dry.  Neurological:     Mental Status: She is alert.     Comments: Follows commands, Moves all extremities  5/5 strength to BUE and BLE  Sensation intact throughout all major nerve distributions Positive SLR on right.   Psychiatric:        Speech: Speech normal.        Behavior: Behavior normal.     ED Results / Procedures / Treatments   Labs (all labs ordered are listed, but only abnormal results are displayed) Labs Reviewed  POC URINE PREG, ED    EKG None  Radiology DG Lumbar Spine Complete  Result Date: 01/07/2020 CLINICAL DATA:  Back pain for 1 week.  No injury. EXAM: LUMBAR SPINE - COMPLETE 4+ VIEW COMPARISON:  02/17/2014 lumbar spine radiographs. FINDINGS: There are 5 non rib-bearing lumbar type vertebral bodies. Normal alignment. Vertebral body heights are preserved. Multilevel endplate degenerative spurring and sclerosis. Mild disc space loss at the L5-S1 level. Paraspinal soft tissues are unremarkable. Nonobstructive bowel gas pattern. IMPRESSION: No acute osseous abnormality. Mild degenerative changes most prominent at the L5-S1 level. Electronically Signed   By: Primitivo Gauze M.D.   On: 01/07/2020 17:13     Procedures Procedures (including critical care time)  Medications Ordered in ED Medications  ketorolac (TORADOL) 30 MG/ML injection 30 mg (30 mg Intramuscular Given 01/07/20 1607)    ED Course  I have reviewed the triage vital signs and the nursing notes.  Pertinent labs & imaging results that were available during my care of the patient were reviewed by me and considered in my medical decision making (see chart for  details).    MDM Rules/Calculators/A&P                      44 y.o. F who presents for back pain x 1 week. No red flag symptoms.  No neurological deficits on exam. Patient is afebrile, non-toxic appearing, sitting comfortably on examination table. Vital signs reviewed and stable. Consider  musculoskeletal strain vs mechanical back pain. History/physical exam is not concerning for cauda equina or spinal abscess. I discussed with patient regarding etiology of her pain. Given lack of trauma, doubt acute fracture or dislocation. I discussed with patient regarding treatment options in the ED. We discussed utility of XR in regards to MSK back pain. After discussions, patient opted for XR.   Lumbar x-ray shows no acute osseous abnormality.  Mild degenerative changes most prominent L5-S1.  Discussed results with patient. RICE protocol and pain medicine indicated and discussed with patient. At this time, patient exhibits no emergent life-threatening condition that require further evaluation in ED. patient is ambulatory with no signs of neurological deficits.  No indication for emergent MRI. Patient had ample opportunity for questions and discussion. All patient's questions were answered with full understanding. Strict return precautions discussed. Patient expresses understanding and agreement to plan.   Portions of this note were generated with Scientist, clinical (histocompatibility and immunogenetics). Dictation errors may occur despite best attempts at proofreading.   Final Clinical Impression(s) / ED  Diagnoses Final diagnoses:  Acute midline low back pain, unspecified whether sciatica present    Rx / DC Orders ED Discharge Orders         Ordered    predniSONE (DELTASONE) 20 MG tablet  Daily     01/07/20 1738    methocarbamol (ROBAXIN) 500 MG tablet  2 times daily     01/07/20 1738           Maxwell Caul, PA-C 01/07/20 1756    LongArlyss Repress, MD 01/10/20 [phone]

## 2020-01-07 NOTE — Discharge Instructions (Signed)
You can take Tylenol or Ibuprofen as directed for pain. You can alternate Tylenol and Ibuprofen every 4 hours. If you take Tylenol at 1pm, then you can take Ibuprofen at 5pm. Then you can take Tylenol again at 9pm.   Take Robaxin as prescribed. This medication will make you drowsy so do not drive or drink alcohol when taking it.  Take Prednisone as directed.   Follow-up with your primary care doctor in 2-4 days. Call their office and let them know you were seen in the ED.   Return to the Emergency Department immediately for any worsening back pain, neck pain, difficulty walking, numbness/weaknss of your arms or legs, urinary or bowel accidents, fever or any other worsening or concerning symptoms.

## 2020-01-07 NOTE — ED Triage Notes (Signed)
Pt arrives to ED with c/o of low back pain that began 1 week ago- no known injury. No numbness or tinging. Pain 7/10 and constant worse with walking.

## 2020-01-07 NOTE — ED Notes (Signed)
Pt transported to xray 

## 2020-04-05 ENCOUNTER — Other Ambulatory Visit: Payer: Self-pay

## 2020-04-05 ENCOUNTER — Emergency Department: Payer: Medicaid Other

## 2020-04-05 ENCOUNTER — Emergency Department
Admission: EM | Admit: 2020-04-05 | Discharge: 2020-04-05 | Disposition: A | Discharge status: Home / Self Care | Payer: Medicaid Other | Attending: Emergency Medicine | Admitting: Emergency Medicine

## 2020-04-05 ENCOUNTER — Encounter: Payer: Self-pay | Admitting: Emergency Medicine

## 2020-04-05 DIAGNOSIS — Z20822 Contact with and (suspected) exposure to covid-19: Secondary | ICD-10-CM | POA: Insufficient documentation

## 2020-04-05 DIAGNOSIS — F17210 Nicotine dependence, cigarettes, uncomplicated: Secondary | ICD-10-CM | POA: Insufficient documentation

## 2020-04-05 DIAGNOSIS — R519 Headache, unspecified: Secondary | ICD-10-CM | POA: Insufficient documentation

## 2020-04-05 DIAGNOSIS — B349 Viral infection, unspecified: Secondary | ICD-10-CM

## 2020-04-05 LAB — SARS CORONAVIRUS 2 BY RT PCR (HOSPITAL ORDER, PERFORMED IN ~~LOC~~ HOSPITAL LAB): SARS Coronavirus 2: NEGATIVE

## 2020-04-05 MED ORDER — LIDOCAINE VISCOUS HCL 2 % MT SOLN: 10.0000 mL | OROMUCOSAL | 0 refills | Status: DC | PRN

## 2020-04-05 MED ORDER — GUAIFENESIN ER 600 MG PO TB12: 600.0000 mg | ORAL_TABLET | Freq: Two times a day (BID) | ORAL | 0 refills | Status: AC

## 2020-04-05 MED ORDER — BENZONATATE 100 MG PO CAPS: 100.0000 mg | ORAL_CAPSULE | Freq: Four times a day (QID) | ORAL | 0 refills | Status: DC | PRN

## 2020-04-05 NOTE — ED Triage Notes (Signed)
Pt here for green productive cough, runny nose, sore throat, headache and "not feeling well". No fever.  Also having bilateral ear pain. Ambulatory without difficulty. No increased WOB.

## 2020-04-05 NOTE — ED Provider Notes (Signed)
[facility] Emergency Department Provider Note  ____________________________________________  Time seen: Approximately 7:42 PM  I have reviewed the triage vital signs and the nursing notes.   HISTORY  Chief Complaint Cough    HPI Grace Evans is a 44 y.o. female who presents the emergency department complaining of chills, nasal congestion, sore throat, bilateral ear pain, cough, shortness of breath.  Patient states that symptoms began last night.  Patient has a mild headache but no neck pain or stiffness, no chest pain, no abdominal pain, no nausea vomiting or diarrhea.  Patient is taken Tylenol with no relief.  Patient states that she had similar symptoms a little over a week ago, was seen by her primary care and diagnosed with a virus.  Symptoms resolved on its own rather quickly.  She had a negative Covid test at that time.  She does have her Covid vaccination.  Patient states that the symptoms began and were worse today than they were last week.  History of GERD, hyperlipidemia.  Other than Tylenol no medications for this complaint prior to arrival.         Past Medical History:  Diagnosis Date  . GERD (gastroesophageal reflux disease)   . Hyperlipidemia     Patient Active Problem List   Diagnosis Date Noted  . Obesity, unspecified 05/08/2012  . History of panic attacks 07/24/2006    Past Surgical History:  Procedure Laterality Date  . CHOLECYSTECTOMY      Prior to Admission medications   Medication Sig Start Date End Date Taking? Authorizing Provider  benzonatate (TESSALON PERLES) 100 MG capsule Take 1 capsule (100 mg total) by mouth every 6 (six) hours as needed. 04/05/20 04/05/21  Natally Ribera, Delorise Royals, PA-C  dibucaine (NUPERCAINAL) 1 % OINT Place 1 application rectally 3 (three) times daily as needed. 10/24/19   Menshew, Charlesetta Ivory, PA-C  guaiFENesin (MUCINEX) 600 MG 12 hr tablet Take 1 tablet (600 mg total) by mouth 2 (two) times daily for  15 days. 04/05/20 04/20/20  Cannon Arreola, Delorise Royals, PA-C  lidocaine (XYLOCAINE) 2 % solution Use as directed 10 mLs in the mouth or throat every 4 (four) hours as needed (sore throat). Gargle and swallow 04/05/20   Nehemiah Mcfarren, Delorise Royals, PA-C  methocarbamol (ROBAXIN) 500 MG tablet Take 1 tablet (500 mg total) by mouth 2 (two) times daily. 01/07/20   Maxwell Caul, PA-C  ondansetron (ZOFRAN) 4 MG tablet Take 4 mg by mouth every 8 (eight) hours as needed for nausea or vomiting.    [provider]  phenazopyridine (PYRIDIUM) 200 MG tablet Take 1 tablet (200 mg total) by mouth 3 (three) times daily as needed for pain. 08/22/19   Triplett, Rulon Eisenmenger B, FNP  albuterol (PROVENTIL HFA;VENTOLIN HFA) 108 (90 Base) MCG/ACT inhaler Inhale 2 puffs into the lungs every 4 (four) hours as needed for wheezing or shortness of breath. Patient not taking: Reported on 05/16/2019 06/11/18 10/24/19  Pati Thinnes, Delorise Royals, PA-C  fluticasone (FLONASE) 50 MCG/ACT nasal spray Place 1 spray into both nostrils 2 (two) times daily. Patient not taking: Reported on 05/16/2019 09/15/17 10/24/19  Petronella Shuford, Delorise Royals, PA-C    Allergies Patient has no known allergies.  Family History  Problem Relation Age of Onset  . Thyroid cancer Mother   . Heart attack Mother   . Congestive Heart Failure Father   . Heart attack Father   . Hyperlipidemia Father   . Seizures Son   . Diabetes Maternal Grandmother   .  Stroke Maternal Grandmother   . Stroke Maternal Grandfather     Social History Social History   Tobacco Use  . Smoking status: Current Every Day Smoker    Packs/day: 1.00    Types: Cigarettes  . Smokeless tobacco: Never Used  Vaping Use  . Vaping Use: Never used  Substance Use Topics  . Alcohol use: No  . Drug use: No     Review of Systems  Constitutional: No fever but positive for chills Eyes: No visual changes. No discharge ENT: Positive for nasal congestion, sore throat Cardiovascular: no chest  pain. Respiratory: Positive cough.  Exertional SOB. Gastrointestinal: No abdominal pain.  No nausea, no vomiting.  No diarrhea.  No constipation. Musculoskeletal: Negative for musculoskeletal pain. Skin: Negative for rash, abrasions, lacerations, ecchymosis. Neurological: Negative for headaches, focal weakness or numbness. 10-point ROS otherwise negative.  ____________________________________________   PHYSICAL EXAM:  VITAL SIGNS: ED Triage Vitals  Enc Vitals Group     BP 04/05/20 1715 (!) 132/105     Pulse Rate 04/05/20 1715 (!) 105     Resp 04/05/20 1715 18     Temp 04/05/20 1715 98.9 F (37.2 C)     Temp Source 04/05/20 1715 Oral     SpO2 04/05/20 1715 97 %     Weight 04/05/20 1715 219 lb (99.3 kg)     Height 04/05/20 1715 5\' 6"  (1.676 m)     Head Circumference --      Peak Flow --      Pain Score 04/05/20 1728 7     Pain Loc --      Pain Edu? --      Excl. in GC? --      Constitutional: Alert and oriented. Well appearing and in no acute distress. Eyes: Conjunctivae are normal. PERRL. EOMI. Head: Atraumatic. ENT:      Ears: EACs unremarkable bilaterally.  TMs are minimally bulging bilaterally.      Nose: No congestion/rhinnorhea.      Mouth/Throat: Mucous membranes are moist.  Oropharynx mildly erythematous but nonedematous.  Uvula is midline. Neck: No stridor.  Neck is supple full range of motion Hematological/Lymphatic/Immunilogical: No cervical lymphadenopathy. Cardiovascular: Normal rate, regular rhythm. Normal S1 and S2.  Good peripheral circulation. Respiratory: Normal respiratory effort without tachypnea or retractions. Lungs CTAB. Good air entry to the bases with no decreased or absent breath sounds. Gastrointestinal: Bowel sounds 4 quadrants. Soft and nontender to palpation. No guarding or rigidity. No palpable masses. No distention. Musculoskeletal: Full range of motion to all extremities. No gross deformities appreciated. Neurologic:  Normal speech and  language. No gross focal neurologic deficits are appreciated.  Skin:  Skin is warm, dry and intact. No rash noted. Psychiatric: Mood and affect are normal. Speech and behavior are normal. Patient exhibits appropriate insight and judgement.   ____________________________________________   LABS (all labs ordered are listed, but only abnormal results are displayed)  Labs Reviewed  SARS CORONAVIRUS 2 BY RT PCR (HOSPITAL ORDER, PERFORMED IN [facility])   ____________________________________________  EKG   ____________________________________________  RADIOLOGY I personally viewed and evaluated these images as part of my medical decision making, as well as reviewing the written report by the radiologist.  DG Chest 2 View  Result Date: 04/05/2020 CLINICAL DATA:  Cough EXAM: CHEST - 2 VIEW COMPARISON:  06/18/2019 FINDINGS: The heart size and mediastinal contours are within normal limits. Both lungs are clear. The visualized skeletal structures are unremarkable. IMPRESSION: No active cardiopulmonary disease. Electronically Signed  By: Jasmine Pang M.D.   On: 04/05/2020 18:36    ____________________________________________    PROCEDURES  Procedure(s) performed:    Procedures    Medications - No data to display   ____________________________________________   INITIAL IMPRESSION / ASSESSMENT AND PLAN / ED COURSE  Pertinent labs & imaging results that were available during my care of the patient were reviewed by me and considered in my medical decision making (see chart for details).  Review of the [place] CSRS was performed in accordance of the [facility] prior to dispensing any controlled drugs.           Patient's diagnosis is consistent with viral illness.  Patient presented to emergency department with symptoms consistent with COVID-19.  She is vaccinated, no close contacts with Covid positive individuals.  Patient with multiple symptoms consistent with Covid  but differential includes sinusitis, viral URI, COVID-19, bronchitis, pneumonia.  Chest x-ray is reassuring with no consolidation concerning for pneumonia.  Given symptoms, I do feel that Covid is likely the diagnosis.  At this time I will provide symptom control medications for the patient.  Follow-up with primary care as needed.  I will swab the patient for Covid but she will be discharged prior to results returning.  If she is positive I have given the patient information how to follow-up with the Covid clinic for MAB/remdesivir as needed..  Patient is given ED precautions to return to the ED for any worsening or new symptoms.     ____________________________________________  FINAL CLINICAL IMPRESSION(S) / ED DIAGNOSES  Final diagnoses:  Viral illness      NEW MEDICATIONS STARTED DURING THIS VISIT:  ED Discharge Orders         Ordered    guaiFENesin (MUCINEX) 600 MG 12 hr tablet  2 times daily        04/05/20 1959    lidocaine (XYLOCAINE) 2 % solution  Every 4 hours PRN        04/05/20 1959    benzonatate (TESSALON PERLES) 100 MG capsule  Every 6 hours PRN        04/05/20 1959              This chart was dictated using voice recognition software/Dragon. Despite best efforts to proofread, errors can occur which can change the meaning. Any change was purely unintentional.    Racheal Patches, PA-C 04/05/20 2000    Arnaldo Natal, MD 04/05/20 [phone]

## 2020-05-27 ENCOUNTER — Emergency Department
Admission: EM | Admit: 2020-05-27 | Discharge: 2020-05-27 | Disposition: A | Discharge status: Home / Self Care | Payer: Self-pay | Attending: Emergency Medicine | Admitting: Emergency Medicine

## 2020-05-27 ENCOUNTER — Encounter: Payer: Self-pay | Admitting: Emergency Medicine

## 2020-05-27 ENCOUNTER — Other Ambulatory Visit: Payer: Self-pay

## 2020-05-27 ENCOUNTER — Emergency Department: Payer: Self-pay

## 2020-05-27 DIAGNOSIS — M5136 Other intervertebral disc degeneration, lumbar region: Secondary | ICD-10-CM | POA: Insufficient documentation

## 2020-05-27 DIAGNOSIS — F17210 Nicotine dependence, cigarettes, uncomplicated: Secondary | ICD-10-CM | POA: Insufficient documentation

## 2020-05-27 LAB — URINALYSIS, COMPLETE (UACMP) WITH MICROSCOPIC
Glucose, UA: NEGATIVE mg/dL
Hgb urine dipstick: NEGATIVE
Ketones, ur: NEGATIVE mg/dL
Leukocytes,Ua: NEGATIVE
Nitrite: NEGATIVE
Protein, ur: 30 mg/dL — AB
Specific Gravity, Urine: >1.046 — ABNORMAL HIGH (ref 1.005–1.030)
pH: 5 (ref 5.0–8.0)

## 2020-05-27 LAB — PREGNANCY, URINE: Preg Test, Ur: NEGATIVE

## 2020-05-27 MED ORDER — CYCLOBENZAPRINE HCL 10 MG PO TABS: 5.0000 mg | ORAL_TABLET | Freq: Three times a day (TID) | ORAL | 0 refills | Status: DC | PRN

## 2020-05-27 MED ORDER — MELOXICAM 15 MG PO TABS: 15.0000 mg | ORAL_TABLET | Freq: Every day | ORAL | 0 refills | Status: DC

## 2020-05-27 MED ORDER — KETOROLAC TROMETHAMINE 60 MG/2ML IM SOLN
30.0000 mg | Freq: Once | INTRAMUSCULAR | Status: AC
  Administered 2020-05-27: 30 mg via INTRAMUSCULAR
  Filled 2020-05-27: qty 2

## 2020-05-27 MED ORDER — CYCLOBENZAPRINE HCL 10 MG PO TABS
5.0000 mg | ORAL_TABLET | Freq: Once | ORAL | Status: AC
  Administered 2020-05-27: 5 mg via ORAL
  Filled 2020-05-27: qty 1

## 2020-05-27 NOTE — ED Triage Notes (Signed)
Pt in via POV, reports lower back pain, new onset today.  Denies any recent injury.  NAD noted at this time.

## 2020-05-27 NOTE — ED Notes (Signed)
Patient declined discharge vital signs. 

## 2020-05-27 NOTE — Discharge Instructions (Signed)
Please follow up with your primary care provider for symptoms that do not improve with medication and rest. Use heat or ice on your back, whichever feels better.  Return to the ER for symptoms that change or worsen if unable to schedule an appointment.

## 2020-05-27 NOTE — ED Notes (Signed)
Lab called to add on pregnancy urine 

## 2020-05-28 NOTE — ED Provider Notes (Signed)
[facility] Emergency Department Provider Note ____________________________________________  Time seen: Approximately 4:47 PM  I have reviewed the triage vital signs and the nursing notes.   HISTORY  Chief Complaint Back Pain    HPI Grace Evans is a 44 y.o. female who presents to the emergency department for evaluation and treatment of nontraumatic back nontraumatic back pain that started today.  Patient states that she helps take care of her parents but otherwise does not do anything strenuous during the day.  Neither of them are total care.  No relief with Tylenol or ibuprofen.   Past Medical History:  Diagnosis Date  . GERD (gastroesophageal reflux disease)   . Hyperlipidemia     Patient Active Problem List   Diagnosis Date Noted  . Obesity, unspecified 05/08/2012  . History of panic attacks 07/24/2006    Past Surgical History:  Procedure Laterality Date  . CHOLECYSTECTOMY      Prior to Admission medications   Medication Sig Start Date End Date Taking? Authorizing Provider  benzonatate (TESSALON PERLES) 100 MG capsule Take 1 capsule (100 mg total) by mouth every 6 (six) hours as needed. 04/05/20 04/05/21  Cuthriell, Delorise Royals, PA-C  cyclobenzaprine (FLEXERIL) 10 MG tablet Take 0.5 tablets (5 mg total) by mouth 3 (three) times daily as needed for muscle spasms. 05/27/20   Kamry Faraci B, FNP  dibucaine (NUPERCAINAL) 1 % OINT Place 1 application rectally 3 (three) times daily as needed. 10/24/19   Menshew, Charlesetta Ivory, PA-C  lidocaine (XYLOCAINE) 2 % solution Use as directed 10 mLs in the mouth or throat every 4 (four) hours as needed (sore throat). Gargle and swallow 04/05/20   Cuthriell, Delorise Royals, PA-C  meloxicam (MOBIC) 15 MG tablet Take 1 tablet (15 mg total) by mouth daily. 05/27/20   Braxtin Bamba, Rulon Eisenmenger B, FNP  methocarbamol (ROBAXIN) 500 MG tablet Take 1 tablet (500 mg total) by mouth 2 (two) times daily. 01/07/20   Maxwell Caul, PA-C   ondansetron (ZOFRAN) 4 MG tablet Take 4 mg by mouth every 8 (eight) hours as needed for nausea or vomiting.    [provider]  phenazopyridine (PYRIDIUM) 200 MG tablet Take 1 tablet (200 mg total) by mouth 3 (three) times daily as needed for pain. 08/22/19   Eris Hannan, Rulon Eisenmenger B, FNP  albuterol (PROVENTIL HFA;VENTOLIN HFA) 108 (90 Base) MCG/ACT inhaler Inhale 2 puffs into the lungs every 4 (four) hours as needed for wheezing or shortness of breath. Patient not taking: Reported on 05/16/2019 06/11/18 10/24/19  Cuthriell, Delorise Royals, PA-C  fluticasone (FLONASE) 50 MCG/ACT nasal spray Place 1 spray into both nostrils 2 (two) times daily. Patient not taking: Reported on 05/16/2019 09/15/17 10/24/19  Cuthriell, Delorise Royals, PA-C    Allergies Patient has no known allergies.  Family History  Problem Relation Age of Onset  . Thyroid cancer Mother   . Heart attack Mother   . Congestive Heart Failure Father   . Heart attack Father   . Hyperlipidemia Father   . Seizures Son   . Diabetes Maternal Grandmother   . Stroke Maternal Grandmother   . Stroke Maternal Grandfather     Social History Social History   Tobacco Use  . Smoking status: Current Every Day Smoker    Packs/day: 1.00    Types: Cigarettes  . Smokeless tobacco: Never Used  Vaping Use  . Vaping Use: Never used  Substance Use Topics  . Alcohol use: No  . Drug use: No  Review of Systems Constitutional: Negative for fever. Cardiovascular: Negative for chest pain. Respiratory: Negative for shortness of breath. Musculoskeletal: Positive for mid back pain. Skin: No open wounds or lesions. Neurological: Negative for decrease in sensation  ____________________________________________   PHYSICAL EXAM:  VITAL SIGNS: ED Triage Vitals  Enc Vitals Group     BP 05/27/20 1635 113/83     Pulse Rate 05/27/20 1635 84     Resp 05/27/20 1635 16     Temp 05/27/20 1635 98.1 F (36.7 C)     Temp Source 05/27/20 1635 Oral     SpO2  05/27/20 1635 97 %     Weight 05/27/20 1621 220 lb (99.8 kg)     Height 05/27/20 1621 5\' 7"  (1.702 m)     Head Circumference --      Peak Flow --      Pain Score 05/27/20 1621 8     Pain Loc --      Pain Edu? --      Excl. in GC? --     Constitutional: Alert and oriented. Well appearing and in no acute distress. Eyes: Conjunctivae are clear without discharge or drainage Head: Atraumatic Neck: Supple Respiratory: No cough. Respirations are even and unlabored. Musculoskeletal: Midline lower lumbar tenderness Neurologic: Motor and sensory function is intact.  Gait is steady. Skin: No wounds, lesions, erythema, or edema over the back. Psychiatric: Affect and behavior are appropriate.  ____________________________________________   LABS (all labs ordered are listed, but only abnormal results are displayed)  Labs Reviewed  URINALYSIS, COMPLETE (UACMP) WITH MICROSCOPIC - Abnormal; Notable for the following components:      Result Value   Color, Urine AMBER (*)    APPearance CLOUDY (*)    Specific Gravity, Urine >1.046 (*)    Bilirubin Urine SMALL (*)    Protein, ur 30 (*)    Bacteria, UA RARE (*)    All other components within normal limits  PREGNANCY, URINE   ____________________________________________  RADIOLOGY  Degenerative disc disease otherwise no acute findings on images of the lumbar spine.  I, 05/29/20, personally viewed and evaluated these images (plain radiographs) as part of my medical decision making, as well as reviewing the written report by the radiologist.  DG Lumbar Spine 2-3 Views  Result Date: 05/27/2020 CLINICAL DATA:  Acute low back pain without known injury. EXAM: LUMBAR SPINE - 2-3 VIEW COMPARISON:  January 07, 2020. FINDINGS: No fracture or spondylolisthesis is noted. Mild degenerative disc disease is noted at L5-S1. Minimal anterior osteophyte formation is noted at L1-2, L2-3 and L4-5. IMPRESSION: Mild multilevel degenerative disc disease. No  acute abnormality is noted. Electronically Signed   By: January 09, 2020 M.D.   On: 05/27/2020 18:36   ____________________________________________   PROCEDURES  Procedures  ____________________________________________   INITIAL IMPRESSION / ASSESSMENT AND PLAN / ED COURSE  Grace Evans is a 44 y.o. who presents to the emergency department for treatment and evaluation of back pain that started today.  See HPI for further details.  Images and exam are reassuring.  Patient will be treated with meloxicam and Flexeril.  She is to see orthopedics for symptoms are not improving over the next week or so.  She is to see primary care or return to the emergency department if she is unable to see orthopedics and is not feeling any better with time, rest, and medication.   Medications  ketorolac (TORADOL) injection 30 mg (30 mg Intramuscular Given 05/27/20 1849)  cyclobenzaprine (  FLEXERIL) tablet 5 mg (5 mg Oral Given 05/27/20 1848)    Pertinent labs & imaging results that were available during my care of the patient were reviewed by me and considered in my medical decision making (see chart for details).   _________________________________________   FINAL CLINICAL IMPRESSION(S) / ED DIAGNOSES  Final diagnoses:  Degenerative disc disease, lumbar    ED Discharge Orders         Ordered    meloxicam (MOBIC) 15 MG tablet  Daily        05/27/20 1846    cyclobenzaprine (FLEXERIL) 10 MG tablet  3 times daily PRN        05/27/20 1846           If controlled substance prescribed during this visit, 12 month history viewed on the [facility] prior to issuing an initial prescription for Schedule II or III opiod.   Chinita Pester, FNP 05/28/20 1655    Chesley Noon, MD 05/28/20 Flossie Buffy

## 2020-08-10 DIAGNOSIS — Z20822 Contact with and (suspected) exposure to covid-19: Secondary | ICD-10-CM | POA: Diagnosis not present

## 2020-09-01 ENCOUNTER — Ambulatory Visit
Admission: RE | Admit: 2020-09-01 | Discharge: 2020-09-01 | Disposition: A | Discharge status: Home / Self Care | Payer: Self-pay | Source: Ambulatory Visit | Attending: Oncology | Admitting: Oncology

## 2020-09-01 ENCOUNTER — Other Ambulatory Visit: Payer: Self-pay

## 2020-09-01 ENCOUNTER — Ambulatory Visit: Payer: Self-pay | Attending: Oncology

## 2020-09-01 VITALS — BP 119/91 | HR 88 | Temp 97.5°F | Ht 65.5 in | Wt 207.0 lb

## 2020-09-01 DIAGNOSIS — N63 Unspecified lump in unspecified breast: Secondary | ICD-10-CM

## 2020-09-01 NOTE — Progress Notes (Signed)
  Subjective:     Patient ID: Grace Evans, female   DOB: [birth year], 45 y.o.   MRN: [id number]  HPI   Review of Systems     Objective:   Physical Exam Chest:  Breasts:     Right: No swelling, bleeding, inverted nipple, mass, nipple discharge, skin change or tenderness.     Left: Mass and tenderness present. No swelling, bleeding, inverted nipple, nipple discharge or skin change.           Assessment:     45 year old patient presents for [facility] clinic visit.  States a week ago she felt a painful left breast lump after picking up firewood.  Patient screened, and meets [facility] eligibility.  Patient does not have insurance, Medicare or Medicaid. Instructed patient on breast self awareness using teach back method.  Palpated upper breast thickening bilateral, more prominent on left.  Patient reports pain at area of thickening with, and without palpation.   History of baseline mammogram in 2013 at [facility]. Risk Assessment    Risk Scores      09/01/2020   Last edited by: Jim Like, RN   5-year risk: 0.6 %   Lifetime risk: 7.1 %            Plan:     Sent for bilateral diagnostic mammogram, and ultrasound.

## 2020-09-02 ENCOUNTER — Telehealth: Payer: Self-pay | Admitting: Family Medicine

## 2020-09-02 NOTE — Telephone Encounter (Signed)
Called pt and asked about the covid vaccine.  Pt has had 2 covid vaccines. I did offer the booster. Pt took my number down and states she will call back to schedule her booster.

## 2020-09-12 ENCOUNTER — Encounter: Payer: Self-pay | Admitting: Emergency Medicine

## 2020-09-12 ENCOUNTER — Emergency Department: Payer: Self-pay

## 2020-09-12 ENCOUNTER — Emergency Department
Admission: EM | Admit: 2020-09-12 | Discharge: 2020-09-12 | Disposition: A | Discharge status: Home / Self Care | Payer: Self-pay | Attending: Emergency Medicine | Admitting: Emergency Medicine

## 2020-09-12 ENCOUNTER — Other Ambulatory Visit: Payer: Self-pay

## 2020-09-12 DIAGNOSIS — F17210 Nicotine dependence, cigarettes, uncomplicated: Secondary | ICD-10-CM | POA: Insufficient documentation

## 2020-09-12 DIAGNOSIS — L03039 Cellulitis of unspecified toe: Secondary | ICD-10-CM

## 2020-09-12 DIAGNOSIS — L03031 Cellulitis of right toe: Secondary | ICD-10-CM | POA: Insufficient documentation

## 2020-09-12 MED ORDER — CEFTRIAXONE SODIUM 1 G IJ SOLR
1.0000 g | Freq: Once | INTRAMUSCULAR | Status: AC
  Administered 2020-09-12: 1 g via INTRAMUSCULAR
  Filled 2020-09-12: qty 10

## 2020-09-12 MED ORDER — HYDROCODONE-ACETAMINOPHEN 5-325 MG PO TABS
1.0000 | ORAL_TABLET | Freq: Once | ORAL | Status: AC
  Administered 2020-09-12: 1 via ORAL
  Filled 2020-09-12: qty 1

## 2020-09-12 MED ORDER — TRAMADOL HCL 50 MG PO TABS: 50.0000 mg | ORAL_TABLET | Freq: Four times a day (QID) | ORAL | 0 refills | Status: DC | PRN

## 2020-09-12 MED ORDER — CEPHALEXIN 500 MG PO CAPS: 500.0000 mg | ORAL_CAPSULE | Freq: Three times a day (TID) | ORAL | 0 refills | Status: AC

## 2020-09-12 MED ORDER — SULFAMETHOXAZOLE-TRIMETHOPRIM 800-160 MG PO TABS: 1.0000 | ORAL_TABLET | Freq: Two times a day (BID) | ORAL | 0 refills | Status: DC

## 2020-09-12 NOTE — Discharge Instructions (Addendum)
Follow up with your regular doctor or podiatry this week for a recheck, Return to the ER if worsening

## 2020-09-12 NOTE — ED Triage Notes (Signed)
Pt to ED via POV, c/o R pinky toe pain x 3-4 weeks. Pt's right pinky toe noted to be red at this time. Pt ambulatory with flip flops on at this time.

## 2020-09-12 NOTE — ED Notes (Signed)
Pt ambulatory to ED42A at this time, steady gait noted. Pt states R pinky toe pain for 3-4 weeks. Pt states it's been getting worse and feels infected. Pt states "I don't think it's broken, but maybe there's something in it"  This RN noted R pinky toe to be red and painful on touch.

## 2020-09-12 NOTE — ED Provider Notes (Signed)
[facility] Emergency Department Provider Note  ____________________________________________   Event Date/Time   First MD Initiated Contact with Patient 09/12/20 1443     (approximate)  I have reviewed the triage vital signs and the nursing notes.   HISTORY  Chief Complaint Toe Pain    HPI Grace Evans is a 45 y.o. female presents emergency department complaining of right fifth toe pain.  Patient states she has had pain for 3 to 4 weeks.  States that she puts her foot in warm water that the toe turns black.  States the area is very painful.  States it is red only on that toe.  States she does not feel well but has not had a fever.  Has not seen any other areas like this.  Patient states she does not think she has diabetes.    Past Medical History:  Diagnosis Date   GERD (gastroesophageal reflux disease)    Hyperlipidemia     Patient Active Problem List   Diagnosis Date Noted   Obesity, unspecified 05/08/2012   History of panic attacks 07/24/2006    Past Surgical History:  Procedure Laterality Date   CHOLECYSTECTOMY      Prior to Admission medications   Medication Sig Start Date End Date Taking? Authorizing Provider  cephALEXin (KEFLEX) 500 MG capsule Take 1 capsule (500 mg total) by mouth 3 (three) times daily for 7 days. 09/12/20 09/19/20 Yes Khilynn Borntreger, Roselyn Bering, PA-C  sulfamethoxazole-trimethoprim (BACTRIM DS) 800-160 MG tablet Take 1 tablet by mouth 2 (two) times daily. 09/12/20  Yes Loran Fleet, Roselyn Bering, PA-C  traMADol (ULTRAM) 50 MG tablet Take 1 tablet (50 mg total) by mouth every 6 (six) hours as needed. 09/12/20  Yes Elissia Spiewak, Roselyn Bering, PA-C  benzonatate (TESSALON PERLES) 100 MG capsule Take 1 capsule (100 mg total) by mouth every 6 (six) hours as needed. 04/05/20 04/05/21  Cuthriell, Delorise Royals, PA-C  cyclobenzaprine (FLEXERIL) 10 MG tablet Take 0.5 tablets (5 mg total) by mouth 3 (three) times daily as needed for muscle spasms. 05/27/20   Triplett,  Cari B, FNP  dibucaine (NUPERCAINAL) 1 % OINT Place 1 application rectally 3 (three) times daily as needed. 10/24/19   Menshew, Charlesetta Ivory, PA-C  lidocaine (XYLOCAINE) 2 % solution Use as directed 10 mLs in the mouth or throat every 4 (four) hours as needed (sore throat). Gargle and swallow 04/05/20   Cuthriell, Delorise Royals, PA-C  meloxicam (MOBIC) 15 MG tablet Take 1 tablet (15 mg total) by mouth daily. 05/27/20   Triplett, Rulon Eisenmenger B, FNP  methocarbamol (ROBAXIN) 500 MG tablet Take 1 tablet (500 mg total) by mouth 2 (two) times daily. 01/07/20   Maxwell Caul, PA-C  ondansetron (ZOFRAN) 4 MG tablet Take 4 mg by mouth every 8 (eight) hours as needed for nausea or vomiting.    [provider]  phenazopyridine (PYRIDIUM) 200 MG tablet Take 1 tablet (200 mg total) by mouth 3 (three) times daily as needed for pain. 08/22/19   Triplett, Rulon Eisenmenger B, FNP  albuterol (PROVENTIL HFA;VENTOLIN HFA) 108 (90 Base) MCG/ACT inhaler Inhale 2 puffs into the lungs every 4 (four) hours as needed for wheezing or shortness of breath. Patient not taking: Reported on 05/16/2019 06/11/18 10/24/19  Cuthriell, Delorise Royals, PA-C  fluticasone (FLONASE) 50 MCG/ACT nasal spray Place 1 spray into both nostrils 2 (two) times daily. Patient not taking: Reported on 05/16/2019 09/15/17 10/24/19  Cuthriell, Delorise Royals, PA-C    Allergies Patient has no known  allergies.  Family History  Problem Relation Age of Onset   Thyroid cancer Mother    Heart attack Mother    Congestive Heart Failure Father    Heart attack Father    Hyperlipidemia Father    Seizures Son    Diabetes Maternal Grandmother    Stroke Maternal Grandmother    Stroke Maternal Grandfather     Social History Social History   Tobacco Use   Smoking status: Current Every Day Smoker    Packs/day: 1.00    Types: Cigarettes   Smokeless tobacco: Never Used  Vaping Use   Vaping Use: Never used  Substance Use Topics   Alcohol use: No   Drug use: No     Review of Systems  Constitutional: No fever/chills Eyes: No visual changes. ENT: No sore throat. Respiratory: Denies cough Cardiovascular: Denies chest pain Gastrointestinal: Denies abdominal pain Genitourinary: Negative for dysuria. Musculoskeletal: Negative for back pain.  Positive for right fifth toe pain Skin: Negative for rash. Psychiatric: no mood changes,     ____________________________________________   PHYSICAL EXAM:  VITAL SIGNS: ED Triage Vitals  Enc Vitals Group     BP 09/12/20 1405 123/87     Pulse Rate 09/12/20 1405 99     Resp 09/12/20 1405 20     Temp 09/12/20 1405 98.1 F (36.7 C)     Temp Source 09/12/20 1405 Oral     SpO2 09/12/20 1405 100 %     Weight 09/12/20 1407 215 lb (97.5 kg)     Height 09/12/20 1407 5\' 6"  (1.676 m)     Head Circumference --      Peak Flow --      Pain Score 09/12/20 1407 7     Pain Loc --      Pain Edu? --      Excl. in GC? --     Constitutional: Alert and oriented. Well appearing and in no acute distress. Eyes: Conjunctivae are normal.  Head: Atraumatic. Nose: No congestion/rhinnorhea. Mouth/Throat: Mucous membranes are moist.  Neck:  supple no lymphadenopathy noted Cardiovascular: Normal rate, regular rhythm.  Respiratory: Normal respiratory effort.  No retractions GU: deferred Musculoskeletal: FROM all extremities, warm and well perfused, right fifth toe is bright red and swollen, area is very tender to palpation, no necrotic areas noted at this time, no drainage is noted, neurovascular is intact Neurologic:  Normal speech and language.  Skin:  Skin is warm, dry and intact. No rash noted. Psychiatric: Mood and affect are normal. Speech and behavior are normal.  ____________________________________________   LABS (all labs ordered are listed, but only abnormal results are displayed)  Labs Reviewed - No data to  display ____________________________________________   ____________________________________________  RADIOLOGY  X-ray of the right fifth toe  ____________________________________________   PROCEDURES  Procedure(s) performed: Rocephin 1 gram IM   Procedures    ____________________________________________   INITIAL IMPRESSION / ASSESSMENT AND PLAN / ED COURSE  Pertinent labs & imaging results that were available during my care of the patient were reviewed by me and considered in my medical decision making (see chart for details).   Patient is 45 year old female presents with right fifth toe pain.  See HPI.  Physical exam shows a right fifth toe to be red and swollen  DDx: Cellulitis, osteomyelitis, necrotic toe  X-ray of the right fifth toe  X-ray which was reviewed by me and confirmed by radiology does not show any osteomyelitis or fracture.  Patient was given Rocephin 1 g  IM.  A prescription for Keflex.  She is to follow-up with her regular doctor or podiatry for recheck this week.  Return emergency department worsening.  She states she understands.  She is discharged stable condition.     RHIANNA RAULERSON was evaluated in Emergency Department on 09/12/2020 for the symptoms described in the history of present illness. She was evaluated in the context of the global COVID-19 pandemic, which necessitated consideration that the patient might be at risk for infection with the SARS-CoV-2 virus that causes COVID-19. Institutional protocols and algorithms that pertain to the evaluation of patients at risk for COVID-19 are in a state of rapid change based on information released by regulatory bodies including the CDC and federal and state organizations. These policies and algorithms were followed during the patient's care in the ED.    As part of my medical decision making, I reviewed the following data within the electronic MEDICAL RECORD NUMBER Nursing notes reviewed and incorporated, Old  chart reviewed, Radiograph reviewed , Notes from prior ED visits and [place] Controlled Substance Database  ____________________________________________   FINAL CLINICAL IMPRESSION(S) / ED DIAGNOSES  Final diagnoses:  Cellulitis of fifth toe      NEW MEDICATIONS STARTED DURING THIS VISIT:  Discharge Medication List as of 09/12/2020  3:55 PM    START taking these medications   Details  cephALEXin (KEFLEX) 500 MG capsule Take 1 capsule (500 mg total) by mouth 3 (three) times daily for 7 days., Starting Sun 09/12/2020, Until Sun 09/19/2020, Normal    sulfamethoxazole-trimethoprim (BACTRIM DS) 800-160 MG tablet Take 1 tablet by mouth 2 (two) times daily., Starting Sun 09/12/2020, Normal    traMADol (ULTRAM) 50 MG tablet Take 1 tablet (50 mg total) by mouth every 6 (six) hours as needed., Starting Sun 09/12/2020, Normal         Note:  This document was prepared using Dragon voice recognition software and may include unintentional dictation errors.    Faythe Ghee, PA-C 09/12/20 1726    Merwyn Katos, MD 09/13/20 [phone]

## 2020-09-26 NOTE — Progress Notes (Signed)
Letter mailed from [facility] to notify of normal mammogram results.  Patient to return in one year for annual screening.  Copy to [facility]. 

## 2020-09-27 ENCOUNTER — Inpatient Hospital Stay
Admission: EM | Admit: 2020-09-27 | Discharge: 2020-10-01 | DRG: 983 | Disposition: A | Discharge status: Home / Self Care | Payer: Self-pay | Attending: Internal Medicine | Admitting: Internal Medicine

## 2020-09-27 ENCOUNTER — Encounter: Payer: Self-pay | Admitting: Emergency Medicine

## 2020-09-27 DIAGNOSIS — I96 Gangrene, not elsewhere classified: Secondary | ICD-10-CM

## 2020-09-27 DIAGNOSIS — Z20822 Contact with and (suspected) exposure to covid-19: Secondary | ICD-10-CM | POA: Diagnosis present

## 2020-09-27 DIAGNOSIS — L03031 Cellulitis of right toe: Principal | ICD-10-CM | POA: Diagnosis present

## 2020-09-27 DIAGNOSIS — F410 Panic disorder [episodic paroxysmal anxiety] without agoraphobia: Secondary | ICD-10-CM | POA: Diagnosis present

## 2020-09-27 DIAGNOSIS — E876 Hypokalemia: Secondary | ICD-10-CM | POA: Diagnosis present

## 2020-09-27 DIAGNOSIS — Z6834 Body mass index (BMI) 34.0-34.9, adult: Secondary | ICD-10-CM

## 2020-09-27 DIAGNOSIS — K219 Gastro-esophageal reflux disease without esophagitis: Secondary | ICD-10-CM | POA: Diagnosis present

## 2020-09-27 DIAGNOSIS — I999 Unspecified disorder of circulatory system: Secondary | ICD-10-CM | POA: Diagnosis present

## 2020-09-27 DIAGNOSIS — R109 Unspecified abdominal pain: Secondary | ICD-10-CM | POA: Diagnosis present

## 2020-09-27 DIAGNOSIS — Z83438 Family history of other disorder of lipoprotein metabolism and other lipidemia: Secondary | ICD-10-CM

## 2020-09-27 DIAGNOSIS — Z791 Long term (current) use of non-steroidal anti-inflammatories (NSAID): Secondary | ICD-10-CM

## 2020-09-27 DIAGNOSIS — E785 Hyperlipidemia, unspecified: Secondary | ICD-10-CM | POA: Diagnosis present

## 2020-09-27 DIAGNOSIS — F17210 Nicotine dependence, cigarettes, uncomplicated: Secondary | ICD-10-CM | POA: Diagnosis present

## 2020-09-27 DIAGNOSIS — Z8249 Family history of ischemic heart disease and other diseases of the circulatory system: Secondary | ICD-10-CM

## 2020-09-27 DIAGNOSIS — E669 Obesity, unspecified: Secondary | ICD-10-CM | POA: Diagnosis present

## 2020-09-27 DIAGNOSIS — Z823 Family history of stroke: Secondary | ICD-10-CM

## 2020-09-27 DIAGNOSIS — L97519 Non-pressure chronic ulcer of other part of right foot with unspecified severity: Secondary | ICD-10-CM | POA: Diagnosis present

## 2020-09-27 DIAGNOSIS — Z833 Family history of diabetes mellitus: Secondary | ICD-10-CM

## 2020-09-27 DIAGNOSIS — K5710 Diverticulosis of small intestine without perforation or abscess without bleeding: Secondary | ICD-10-CM | POA: Diagnosis present

## 2020-09-27 DIAGNOSIS — R197 Diarrhea, unspecified: Secondary | ICD-10-CM | POA: Diagnosis present

## 2020-09-27 DIAGNOSIS — Z79899 Other long term (current) drug therapy: Secondary | ICD-10-CM

## 2020-09-27 DIAGNOSIS — I998 Other disorder of circulatory system: Secondary | ICD-10-CM | POA: Diagnosis present

## 2020-09-27 LAB — CBC WITH DIFFERENTIAL/PLATELET
Abs Immature Granulocytes: 0.04 10*3/uL (ref 0.00–0.07)
Basophils Absolute: 0.1 10*3/uL (ref 0.0–0.1)
Basophils Relative: 1 %
Eosinophils Absolute: 0.3 10*3/uL (ref 0.0–0.5)
Eosinophils Relative: 3 %
HCT: 45.5 % (ref 36.0–46.0)
Hemoglobin: 15.3 g/dL — ABNORMAL HIGH (ref 12.0–15.0)
Immature Granulocytes: 1 %
Lymphocytes Relative: 14 %
Lymphs Abs: 1.2 10*3/uL (ref 0.7–4.0)
MCH: 30.4 pg (ref 26.0–34.0)
MCHC: 33.6 g/dL (ref 30.0–36.0)
MCV: 90.3 fL (ref 80.0–100.0)
Monocytes Absolute: 1 10*3/uL (ref 0.1–1.0)
Monocytes Relative: 12 %
Neutro Abs: 6.1 10*3/uL (ref 1.7–7.7)
Neutrophils Relative %: 69 %
Platelets: 294 10*3/uL (ref 150–400)
RBC: 5.04 MIL/uL (ref 3.87–5.11)
RDW: 13.7 % (ref 11.5–15.5)
WBC: 8.6 10*3/uL (ref 4.0–10.5)
nRBC: 0 % (ref 0.0–0.2)

## 2020-09-27 LAB — LACTIC ACID, PLASMA: Lactic Acid, Venous: 1.1 mmol/L (ref 0.5–1.9)

## 2020-09-27 LAB — COMPREHENSIVE METABOLIC PANEL
ALT: 16 U/L (ref 0–44)
AST: 20 U/L (ref 15–41)
Albumin: 4 g/dL (ref 3.5–5.0)
Alkaline Phosphatase: 76 U/L (ref 38–126)
Anion gap: 8 (ref 5–15)
BUN: 5 mg/dL — ABNORMAL LOW (ref 6–20)
CO2: 27 mmol/L (ref 22–32)
Calcium: 8.7 mg/dL — ABNORMAL LOW (ref 8.9–10.3)
Chloride: 101 mmol/L (ref 98–111)
Creatinine, Ser: 0.65 mg/dL (ref 0.44–1.00)
GFR, Estimated: >60 mL/min (ref >60)
Glucose, Bld: 108 mg/dL — ABNORMAL HIGH (ref 70–99)
Potassium: 3.2 mmol/L — ABNORMAL LOW (ref 3.5–5.1)
Sodium: 136 mmol/L (ref 135–145)
Total Bilirubin: 0.7 mg/dL (ref 0.3–1.2)
Total Protein: 7.5 g/dL (ref 6.5–8.1)

## 2020-09-27 MED ORDER — ENOXAPARIN SODIUM 40 MG/0.4ML ~~LOC~~ SOLN: 40.0000 mg | SUBCUTANEOUS | Status: DC

## 2020-09-27 MED ORDER — ACETAMINOPHEN 325 MG PO TABS
650.0000 mg | ORAL_TABLET | Freq: Four times a day (QID) | ORAL | Status: DC | PRN
  Administered 2020-09-28 – 2020-10-01 (×2): 650 mg via ORAL
  Filled 2020-09-27 (×2): qty 2

## 2020-09-27 MED ORDER — NICOTINE 14 MG/24HR TD PT24
14.0000 mg | MEDICATED_PATCH | Freq: Every day | TRANSDERMAL | Status: DC
  Administered 2020-09-30 – 2020-10-01 (×2): 14 mg via TRANSDERMAL
  Filled 2020-09-27 (×3): qty 1

## 2020-09-27 MED ORDER — KETOROLAC TROMETHAMINE 30 MG/ML IJ SOLN
30.0000 mg | Freq: Four times a day (QID) | INTRAMUSCULAR | Status: DC | PRN
  Administered 2020-09-28 – 2020-09-29 (×2): 30 mg via INTRAVENOUS
  Filled 2020-09-27 (×2): qty 1

## 2020-09-27 MED ORDER — ONDANSETRON HCL 4 MG/2ML IJ SOLN: 4.0000 mg | Freq: Four times a day (QID) | INTRAMUSCULAR | Status: DC | PRN

## 2020-09-27 MED ORDER — HYDROCODONE-ACETAMINOPHEN 5-325 MG PO TABS
1.0000 | ORAL_TABLET | ORAL | Status: DC | PRN
  Administered 2020-09-30 (×2): 1 via ORAL
  Filled 2020-09-27 (×2): qty 1

## 2020-09-27 MED ORDER — ACETAMINOPHEN 650 MG RE SUPP: 650.0000 mg | Freq: Four times a day (QID) | RECTAL | Status: DC | PRN

## 2020-09-27 MED ORDER — ONDANSETRON HCL 4 MG PO TABS: 4.0000 mg | ORAL_TABLET | Freq: Four times a day (QID) | ORAL | Status: DC | PRN

## 2020-09-27 NOTE — ED Provider Notes (Signed)
[facility]-EMERGENCY DEPARTMENT  ____________________________________________  Time seen: Approximately 10:47 PM  I have reviewed the triage vital signs and the nursing notes.   HISTORY  Chief Complaint Toe Pain   Historian Patient     HPI Grace Evans is a 45 y.o. female presents to the emergency department with concern for necrotic right fifth toe.  Patient was seen on 2 6 and was diagnosed with cellulitis and was discharged with 2 antibiotics.  Patient states that she completed the antibiotics as directed.  She has noticed that her toe has become darker in appearance and has felt cold to the touch.  Patient denies a history of diabetes but states that she has a daily smoker.   Past Medical History:  Diagnosis Date  . GERD (gastroesophageal reflux disease)   . Hyperlipidemia      Immunizations up to date:  Yes.     Past Medical History:  Diagnosis Date  . GERD (gastroesophageal reflux disease)   . Hyperlipidemia     Patient Active Problem List   Diagnosis Date Noted  . Ischemic ulcer of toe of right foot, with unspecified severity (HCC) 09/27/2020  . Ischemic toe 09/27/2020  . Nicotine dependence, cigarettes, uncomplicated 09/27/2020  . Ischemic toe ulcer, right, with unspecified severity (HCC) 09/27/2020  . Obesity, unspecified 05/08/2012  . History of panic attacks 07/24/2006    Past Surgical History:  Procedure Laterality Date  . CHOLECYSTECTOMY      Prior to Admission medications   Medication Sig Start Date End Date Taking? Authorizing Provider  benzonatate (TESSALON PERLES) 100 MG capsule Take 1 capsule (100 mg total) by mouth every 6 (six) hours as needed. 04/05/20 04/05/21  Cuthriell, Delorise Royals, PA-C  cyclobenzaprine (FLEXERIL) 10 MG tablet Take 0.5 tablets (5 mg total) by mouth 3 (three) times daily as needed for muscle spasms. 05/27/20   Triplett, Cari B, FNP  dibucaine (NUPERCAINAL) 1 % OINT Place 1 application rectally 3 (three) times daily as  needed. 10/24/19   Menshew, Charlesetta Ivory, PA-C  lidocaine (XYLOCAINE) 2 % solution Use as directed 10 mLs in the mouth or throat every 4 (four) hours as needed (sore throat). Gargle and swallow 04/05/20   Cuthriell, Delorise Royals, PA-C  meloxicam (MOBIC) 15 MG tablet Take 1 tablet (15 mg total) by mouth daily. 05/27/20   Triplett, Rulon Eisenmenger B, FNP  methocarbamol (ROBAXIN) 500 MG tablet Take 1 tablet (500 mg total) by mouth 2 (two) times daily. 01/07/20   Maxwell Caul, PA-C  ondansetron (ZOFRAN) 4 MG tablet Take 4 mg by mouth every 8 (eight) hours as needed for nausea or vomiting.    [provider]  phenazopyridine (PYRIDIUM) 200 MG tablet Take 1 tablet (200 mg total) by mouth 3 (three) times daily as needed for pain. 08/22/19   Triplett, Rulon Eisenmenger B, FNP  sulfamethoxazole-trimethoprim (BACTRIM DS) 800-160 MG tablet Take 1 tablet by mouth 2 (two) times daily. 09/12/20   Sherrie Mustache Roselyn Bering, PA-C  traMADol (ULTRAM) 50 MG tablet Take 1 tablet (50 mg total) by mouth every 6 (six) hours as needed. 09/12/20   Sherrie Mustache Roselyn Bering, PA-C  albuterol (PROVENTIL HFA;VENTOLIN HFA) 108 (90 Base) MCG/ACT inhaler Inhale 2 puffs into the lungs every 4 (four) hours as needed for wheezing or shortness of breath. Patient not taking: Reported on 05/16/2019 06/11/18 10/24/19  Cuthriell, Delorise Royals, PA-C  fluticasone (FLONASE) 50 MCG/ACT nasal spray Place 1 spray into both nostrils 2 (two) times daily. Patient not taking: Reported on 05/16/2019 09/15/17  10/24/19  Cuthriell, Delorise Royals, PA-C    Allergies Patient has no known allergies.  Family History  Problem Relation Age of Onset  . Thyroid cancer Mother   . Heart attack Mother   . Congestive Heart Failure Father   . Heart attack Father   . Hyperlipidemia Father   . Seizures Son   . Diabetes Maternal Grandmother   . Stroke Maternal Grandmother   . Stroke Maternal Grandfather     Social History Social History   Tobacco Use  . Smoking status: Current Every Day Smoker     Packs/day: 1.00    Types: Cigarettes  . Smokeless tobacco: Never Used  Vaping Use  . Vaping Use: Never used  Substance Use Topics  . Alcohol use: No  . Drug use: No     Review of Systems  Constitutional: No fever/chills Eyes:  No discharge ENT: No upper respiratory complaints. Respiratory: no cough. No SOB/ use of accessory muscles to breath Gastrointestinal:   No nausea, no vomiting.  No diarrhea.  No constipation. Musculoskeletal: Patient has right fifth toe pain.  Skin: Negative for rash, abrasions, lacerations, ecchymosis.    ____________________________________________   PHYSICAL EXAM:  VITAL SIGNS: ED Triage Vitals [09/27/20 1955]  Enc Vitals Group     BP 136/90     Pulse Rate (!) 106     Resp 18     Temp 98.6 F (37 C)     Temp Source Oral     SpO2 99 %     Weight      Height      Head Circumference      Peak Flow      Pain Score      Pain Loc      Pain Edu?      Excl. in GC?      Constitutional: Alert and oriented. Well appearing and in no acute distress. Eyes: Conjunctivae are normal. PERRL. EOMI. Head: Atraumatic. Cardiovascular: Normal rate, regular rhythm. Normal S1 and S2.  Good peripheral circulation. Respiratory: Normal respiratory effort without tachypnea or retractions. Lungs CTAB. Good air entry to the bases with no decreased or absent breath sounds Gastrointestinal: Bowel sounds x 4 quadrants. Soft and nontender to palpation. No guarding or rigidity. No distention. Musculoskeletal: Full range of motion to all extremities. No obvious deformities noted Neurologic:  Normal for age. No gross focal neurologic deficits are appreciated.  Skin: Patient's right fifth toe is dusky in appearance with a small fissure along the dorsal aspect of the toe.  Palpable dorsalis pedis pulse, right. Psychiatric: Mood and affect are normal for age. Speech and behavior are normal.   ____________________________________________   LABS (all labs ordered are  listed, but only abnormal results are displayed)  Labs Reviewed  COMPREHENSIVE METABOLIC PANEL - Abnormal; Notable for the following components:      Result Value   Potassium 3.2 (*)    Glucose, Bld 108 (*)    BUN 5 (*)    Calcium 8.7 (*)    All other components within normal limits  CBC WITH DIFFERENTIAL/PLATELET - Abnormal; Notable for the following components:   Hemoglobin 15.3 (*)    All other components within normal limits  SARS CORONAVIRUS 2 (TAT 6-24 HRS)  LACTIC ACID, PLASMA  LACTIC ACID, PLASMA  URINALYSIS, COMPLETE (UACMP) WITH MICROSCOPIC  HIV ANTIBODY (ROUTINE TESTING W REFLEX)   ____________________________________________  EKG   ____________________________________________  RADIOLOGY   No results found.  ____________________________________________    PROCEDURES  Procedure(s) performed:     Procedures     Medications  enoxaparin (LOVENOX) injection 40 mg (has no administration in time range)  acetaminophen (TYLENOL) tablet 650 mg (has no administration in time range)    Or  acetaminophen (TYLENOL) suppository 650 mg (has no administration in time range)  HYDROcodone-acetaminophen (NORCO/VICODIN) 5-325 MG per tablet 1-2 tablet (has no administration in time range)  ketorolac (TORADOL) 30 MG/ML injection 30 mg (has no administration in time range)  ondansetron (ZOFRAN) tablet 4 mg (has no administration in time range)    Or  ondansetron (ZOFRAN) injection 4 mg (has no administration in time range)  nicotine (NICODERM CQ - dosed in mg/24 hours) patch 14 mg (has no administration in time range)     ____________________________________________   INITIAL IMPRESSION / ASSESSMENT AND PLAN / ED COURSE  Pertinent labs & imaging results that were available during my care of the patient were reviewed by me and considered in my medical decision making (see chart for details).     Assessment and plan Necrotic toe 45 year old female presents to  the emergency department with concern for right fifth toe which has become dusky and dark in appearance.  Patient was mildly tachycardic at triage but vital signs were otherwise reassuring.  CBC and CMP were reassuring.  Lactic was within reference range.  On physical exam, right fifth toe had early stages of necrosis.  I reached out to Vascular PA Cleda Daub who recommended admission with anticipation of conducting angio tomorrow.  She reviewed pictures of patient's right fifth toe that were taken during this emergency department encounter.  I consulted hospitalist on-call, Dr. Para March who accepted patient for admission.          ____________________________________________  FINAL CLINICAL IMPRESSION(S) / ED DIAGNOSES  Final diagnoses:  Necrotic toes (HCC)      NEW MEDICATIONS STARTED DURING THIS VISIT:  ED Discharge Orders    None          This chart was dictated using voice recognition software/Dragon. Despite best efforts to proofread, errors can occur which can change the meaning. Any change was purely unintentional.     Orvil Feil, PA-C 09/27/20 2301    Gilles Chiquito, MD 09/28/20 [phone]

## 2020-09-27 NOTE — ED Triage Notes (Signed)
Pt reports she was seen and diagnosed on 2/6 with cellulitis. Pt prescribed 2 antibiotics that she completed 1 week ago. Pt back to ED tonight due to continued pain. Rt 5th toe is bruised in color, no redness, streaking or heat noted.

## 2020-09-27 NOTE — H&P (Signed)
History and Physical    Grace Evans JKK:[id number] DOB: 23-Mar-1976 DOA: 09/27/2020  PCP: Lorn Junes, FNP   Patient coming from: home  I have personally briefly reviewed patient's old medical records in [facility]  Chief Complaint: painful right fifth toe   HPI: Grace Evans is a 45 y.o. female with medical history significant for GERD and nicotine dependence, seen in the ER on 2/6 for cellulitis of the right fifth toe treated with antibiotics who returns to the ER with a complaint that her right fifth toe continues to be very painful and notices that when she puts her foot in warm water the toe turns very dark.  She has had no improvement with antibiotic therapy.  She denies fever or chills.  She has redness of the toe but with no streaking or swelling. ED Course: On arrival, she was afebrile, BP 136/90, pulse 106 O2 sat 99% on room air.  On arrival, afebrile with pulse 106, BP 136/90 O2 sat 99% on room air.  Blood work unremarkable with WBC normal at 8600, lactic acid 1.1.  CMP significant only for potassium of 3.2, otherwise unremarkable EKG : Not done in the ED  imaging: Not done  The ED provider spoke with vascular surgeon, Dr. Wyn Quaker who would like patient admitted to the hospitalist service for angiogram in the a.m.  Hospitalist consulted.  Review of Systems: As per HPI otherwise all other systems on review of systems negative.    Past Medical History:  Diagnosis Date  . GERD (gastroesophageal reflux disease)   . Hyperlipidemia     Past Surgical History:  Procedure Laterality Date  . CHOLECYSTECTOMY       reports that she has been smoking cigarettes. She has been smoking about 1.00 pack per day. She has never used smokeless tobacco. She reports that she does not drink alcohol and does not use drugs.  No Known Allergies  Family History  Problem Relation Age of Onset  . Thyroid cancer Mother   . Heart attack Mother   . Congestive Heart Failure Father   .  Heart attack Father   . Hyperlipidemia Father   . Seizures Son   . Diabetes Maternal Grandmother   . Stroke Maternal Grandmother   . Stroke Maternal Grandfather       Prior to Admission medications   Medication Sig Start Date End Date Taking? Authorizing Provider  benzonatate (TESSALON PERLES) 100 MG capsule Take 1 capsule (100 mg total) by mouth every 6 (six) hours as needed. 04/05/20 04/05/21  Cuthriell, Delorise Royals, PA-C  cyclobenzaprine (FLEXERIL) 10 MG tablet Take 0.5 tablets (5 mg total) by mouth 3 (three) times daily as needed for muscle spasms. 05/27/20   Triplett, Cari B, FNP  dibucaine (NUPERCAINAL) 1 % OINT Place 1 application rectally 3 (three) times daily as needed. 10/24/19   Menshew, Charlesetta Ivory, PA-C  lidocaine (XYLOCAINE) 2 % solution Use as directed 10 mLs in the mouth or throat every 4 (four) hours as needed (sore throat). Gargle and swallow 04/05/20   Cuthriell, Delorise Royals, PA-C  meloxicam (MOBIC) 15 MG tablet Take 1 tablet (15 mg total) by mouth daily. 05/27/20   Triplett, Rulon Eisenmenger B, FNP  methocarbamol (ROBAXIN) 500 MG tablet Take 1 tablet (500 mg total) by mouth 2 (two) times daily. 01/07/20   Maxwell Caul, PA-C  ondansetron (ZOFRAN) 4 MG tablet Take 4 mg by mouth every 8 (eight) hours as needed for nausea or vomiting.  [provider]  phenazopyridine (PYRIDIUM) 200 MG tablet Take 1 tablet (200 mg total) by mouth 3 (three) times daily as needed for pain. 08/22/19   Triplett, Rulon Eisenmenger B, FNP  sulfamethoxazole-trimethoprim (BACTRIM DS) 800-160 MG tablet Take 1 tablet by mouth 2 (two) times daily. 09/12/20   Sherrie Mustache Roselyn Bering, PA-C  traMADol (ULTRAM) 50 MG tablet Take 1 tablet (50 mg total) by mouth every 6 (six) hours as needed. 09/12/20   Sherrie Mustache Roselyn Bering, PA-C  albuterol (PROVENTIL HFA;VENTOLIN HFA) 108 (90 Base) MCG/ACT inhaler Inhale 2 puffs into the lungs every 4 (four) hours as needed for wheezing or shortness of breath. Patient not taking: Reported on 05/16/2019 06/11/18  10/24/19  Cuthriell, Delorise Royals, PA-C  fluticasone (FLONASE) 50 MCG/ACT nasal spray Place 1 spray into both nostrils 2 (two) times daily. Patient not taking: Reported on 05/16/2019 09/15/17 10/24/19  Racheal Patches, PA-C    Physical Exam: Vitals:   09/27/20 1955  BP: 136/90  Pulse: (!) 106  Resp: 18  Temp: 98.6 F (37 C)  TempSrc: Oral  SpO2: 99%     Vitals:   09/27/20 1955  BP: 136/90  Pulse: (!) 106  Resp: 18  Temp: 98.6 F (37 C)  TempSrc: Oral  SpO2: 99%      Constitutional: Alert and oriented x 3 . Not in any apparent distress HEENT:      Head: Normocephalic and atraumatic.         Eyes: PERLA, EOMI, Conjunctivae are normal. Sclera is non-icteric.       Mouth/Throat: Mucous membranes are moist.       Neck: Supple with no signs of meningismus. Cardiovascular: Regular rate and rhythm. No murmurs, gallops, or rubs. 1+ symmetrical distal pulses are present . No JVD. No LE edema Respiratory: Respiratory effort normal .Lungs sounds clear bilaterally. No wheezes, crackles, or rhonchi.  Gastrointestinal: Soft, non tender, and non distended with positive bowel sounds.  Genitourinary: No CVA tenderness. Musculoskeletal: Nontender with normal range of motion in all extremities. 3 mm shallow ulcer lateral aspect right fifth toe very tender with redness involving entire toe and bottom of foot metatarsal head Neurologic:  Face is symmetric. Moving all extremities. No gross focal neurologic deficits . Skin:  3 mm shallow ulcer lateral aspect right fifth toe very tender with redness involving entire toe and bottom of foot metatarsal head Psychiatric: Mood and affect are normal    Labs on Admission: I have personally reviewed following labs and imaging studies  CBC: Recent Labs  Lab 09/27/20 2006  WBC 8.6  NEUTROABS 6.1  HGB 15.3*  HCT 45.5  MCV 90.3  PLT 294   Basic Metabolic Panel: Recent Labs  Lab 09/27/20 2006  NA 136  K 3.2*  CL 101  CO2 27  GLUCOSE 108*   BUN 5*  CREATININE 0.65  CALCIUM 8.7*   GFR: CrCl cannot be calculated (Unknown ideal weight.). Liver Function Tests: Recent Labs  Lab 09/27/20 2006  AST 20  ALT 16  ALKPHOS 76  BILITOT 0.7  PROT 7.5  ALBUMIN 4.0   No results for input(s): LIPASE, AMYLASE in the last 168 hours. No results for input(s): AMMONIA in the last 168 hours. Coagulation Profile: No results for input(s): INR, PROTIME in the last 168 hours. Cardiac Enzymes: No results for input(s): CKTOTAL, CKMB, CKMBINDEX, TROPONINI in the last 168 hours. BNP (last 3 results) No results for input(s): PROBNP in the last 8760 hours. HbA1C: No results for input(s): HGBA1C in the  last 72 hours. CBG: No results for input(s): GLUCAP in the last 168 hours. Lipid Profile: No results for input(s): CHOL, HDL, LDLCALC, TRIG, CHOLHDL, LDLDIRECT in the last 72 hours. Thyroid Function Tests: No results for input(s): TSH, T4TOTAL, FREET4, T3FREE, THYROIDAB in the last 72 hours. Anemia Panel: No results for input(s): VITAMINB12, FOLATE, FERRITIN, TIBC, IRON, RETICCTPCT in the last 72 hours. Urine analysis:    Component Value Date/Time   COLORURINE AMBER (A) 05/27/2020 1623   APPEARANCEUR CLOUDY (A) 05/27/2020 1623   APPEARANCEUR Clear 10/30/2014 2343   LABSPEC >1.046 (H) 05/27/2020 1623   LABSPEC 1.008 10/30/2014 2343   PHURINE 5.0 05/27/2020 1623   GLUCOSEU NEGATIVE 05/27/2020 1623   GLUCOSEU Negative 10/30/2014 2343   HGBUR NEGATIVE 05/27/2020 1623   BILIRUBINUR SMALL (A) 05/27/2020 1623   BILIRUBINUR Negative 10/30/2014 2343   KETONESUR NEGATIVE 05/27/2020 1623   PROTEINUR 30 (A) 05/27/2020 1623   UROBILINOGEN 1.0 01/17/2015 1634   NITRITE NEGATIVE 05/27/2020 1623   LEUKOCYTESUR NEGATIVE 05/27/2020 1623   LEUKOCYTESUR Negative 10/30/2014 2343    Radiological Exams on Admission: No results found.   Assessment/Plan 45 year old female with history of GERD and nicotine dependence, seen in the ER on 2/6 for  cellulitis of the right fifth toe treated with antibiotics who returns with a complaint of continued pain, no improvement with antibiotics,  and notices that when she puts her foot in warm water the toe turns very dark blue.      Ischemic ulcer of fifth toe of right foot,unspecified severity (HCC)   Ischemic toe -Patient with suspected ischemic pain of toe, not responding to trial of antibiotics -Vascular consult -Pain management -N.p.o. after midnight    Nicotine dependence, cigarettes, uncomplicated -Counseled on cessation -Nicotine patch  GERD -Protonix as needed    DVT prophylaxis: Lovenox  Code Status: full code  Family Communication:  none  Disposition Plan: Back to previous home environment Consults called: Vascular Status:.  Observation    Andris Baumann MD [facility]     09/27/2020, 9:52 PM

## 2020-09-28 ENCOUNTER — Other Ambulatory Visit: Payer: Self-pay

## 2020-09-28 ENCOUNTER — Encounter: Payer: Self-pay | Admitting: Internal Medicine

## 2020-09-28 ENCOUNTER — Inpatient Hospital Stay: Payer: Self-pay

## 2020-09-28 DIAGNOSIS — R1084 Generalized abdominal pain: Secondary | ICD-10-CM

## 2020-09-28 DIAGNOSIS — I998 Other disorder of circulatory system: Secondary | ICD-10-CM

## 2020-09-28 DIAGNOSIS — R197 Diarrhea, unspecified: Secondary | ICD-10-CM

## 2020-09-28 DIAGNOSIS — F17210 Nicotine dependence, cigarettes, uncomplicated: Secondary | ICD-10-CM

## 2020-09-28 DIAGNOSIS — R109 Unspecified abdominal pain: Secondary | ICD-10-CM | POA: Diagnosis present

## 2020-09-28 LAB — C DIFFICILE QUICK SCREEN W PCR REFLEX
C Diff antigen: NEGATIVE
C Diff interpretation: NOT DETECTED
C Diff toxin: NEGATIVE

## 2020-09-28 LAB — HIV ANTIBODY (ROUTINE TESTING W REFLEX): HIV Screen 4th Generation wRfx: NONREACTIVE

## 2020-09-28 LAB — GASTROINTESTINAL PANEL BY PCR, STOOL (REPLACES STOOL CULTURE)

## 2020-09-28 LAB — TSH: TSH: 0.586 u[IU]/mL (ref 0.350–4.500)

## 2020-09-28 LAB — LACTIC ACID, PLASMA: Lactic Acid, Venous: 0.8 mmol/L (ref 0.5–1.9)

## 2020-09-28 LAB — LIPASE, BLOOD: Lipase: 20 U/L (ref 11–51)

## 2020-09-28 LAB — SARS CORONAVIRUS 2 (TAT 6-24 HRS): SARS Coronavirus 2: NEGATIVE

## 2020-09-28 MED ORDER — ALUM & MAG HYDROXIDE-SIMETH 200-200-20 MG/5ML PO SUSP
30.0000 mL | Freq: Four times a day (QID) | ORAL | Status: DC | PRN
  Administered 2020-09-28: 30 mL via ORAL
  Filled 2020-09-28: qty 30

## 2020-09-28 MED ORDER — IOHEXOL 300 MG/ML  SOLN
100.0000 mL | Freq: Once | INTRAMUSCULAR | Status: AC | PRN
  Administered 2020-09-28: 100 mL via INTRAVENOUS

## 2020-09-28 MED ORDER — IOHEXOL 9 MG/ML PO SOLN
500.0000 mL | ORAL | Status: AC
  Administered 2020-09-28: 500 mL via ORAL

## 2020-09-28 MED ORDER — ENOXAPARIN SODIUM 60 MG/0.6ML ~~LOC~~ SOLN
0.5000 mg/kg | SUBCUTANEOUS | Status: DC
  Administered 2020-09-29 – 2020-10-01 (×3): 50 mg via SUBCUTANEOUS
  Filled 2020-09-28 (×3): qty 0.6

## 2020-09-28 NOTE — Progress Notes (Signed)
[facility] Daily Progress Note  Plan angiogram Thursday with Dr. Wyn Quaker Full consult to follow  Cleda Daub PA-C 09/28/2020 5:04 PM

## 2020-09-28 NOTE — Progress Notes (Signed)
PHARMACIST - PHYSICIAN COMMUNICATION  CONCERNING:  Enoxaparin (Lovenox) for DVT Prophylaxis    RECOMMENDATION: Patient was prescribed enoxaprin 40mg  q24 hours for VTE prophylaxis.   Filed Weights   09/28/20 0039  Weight: 97.5 kg (214 lb 15.2 oz)    Body mass index is 34.69 kg/m.  Estimated Creatinine Clearance: 105.7 mL/min (by C-G formula based on SCr of 0.65 mg/dL).   Based on [facility] policy patient is candidate for enoxaparin 0.5mg /kg TBW SQ every 24 hours based on BMI being >30.  DESCRIPTION: Pharmacy has adjusted enoxaparin dose per [facility] policy.  Patient is now receiving enoxaparin 0.5 mg/kg every 24 hours    [facility], PharmD, [facility] 09/28/2020 12:48 AM

## 2020-09-28 NOTE — Progress Notes (Signed)
PROGRESS NOTE    Grace Evans  DGU:[id number] DOB: 16-Mar-1976 DOA: 09/27/2020 PCP: Lorn Junes, FNP     Brief Narrative:  Grace Evans is a 45 y.o. WF PMHx GERD, nicotine Abuse, HLD, seen in the ER on 2/6 for cellulitis of the right fifth toe treated with antibiotics   who returns to the ER with a complaint that her right fifth toe continues to be very painful and notices that when she puts her foot in warm water the toe turns very dark.  She has had no improvement with antibiotic therapy.  She denies fever or chills.  She has redness of the toe but with no streaking or swelling. ED Course: On arrival, she was afebrile, BP 136/90, pulse 106 O2 sat 99% on room air.  On arrival, afebrile with pulse 106, BP 136/90 O2 sat 99% on room air.  Blood work unremarkable with WBC normal at 8600, lactic acid 1.1.  CMP significant only for potassium of 3.2, otherwise unremarkable EKG : Not done in the ED  imaging: Not done  The ED provider spoke with vascular surgeon, Dr. Wyn Quaker who would like patient admitted to the hospitalist service for angiogram in the a.m.  Hospitalist consulted.    Subjective: A/O x4, continued foot pain but improved.  Discoloration of RIGHT fifth metatarsal improved (patient had picture on phone for comparison).   Assessment & Plan: Covid vaccination;   Active Problems:   Ischemic ulcer of toe of right foot, with unspecified severity (HCC)   Ischemic toe   Nicotine dependence, cigarettes, uncomplicated   Ischemic toe ulcer, right, with unspecified severity (HCC)   Watery diarrhea   Abdominal pain   Ischemic ulcer of fifth toe of right foot,unspecified severity (HCC) -2/22  improving with antibiotics (patient had picture on phone for comparison).  -Per EMR vascular surgery consulted.,  Scheduled for angiogram 2/23 -Continue pain management.  Tobacco abuse -Patient counseled extensively months discontinue smoking -Nicotine patch   GERD -Protonix as  needed  Watery diarrhea/Abdominal Pain on antibiotics -2/22 GI panel pending -2/22 C. difficile panel pending -2/22 CT abdomen pending R/O diverticulosis -Lipase/TSH pending -2/22 fecal occult blood pending      DVT prophylaxis: Lovenox Code Status: Full Family Communication:  Status is: Inpatient    Dispo: The patient is from: Home              Anticipated d/c is to: Home              Anticipated d/c date is: Per surgery              Patient currently unstable      Consultants:  Dr. Wyn Quaker Vascular Surgery   Procedures/Significant Events:    I have personally reviewed and interpreted all radiology studies and my findings are as above.  VENTILATOR SETTINGS:    Cultures   Antimicrobials: Anti-infectives (From admission, onward)   None       Devices    LINES / TUBES:      Continuous Infusions:   Objective: Vitals:   09/28/20 0422 09/28/20 0739 09/28/20 1132 09/28/20 1517  BP: 107/75 103/67 98/69 94/61   Pulse: 100 (!) 110 92 88  Resp: 18 20 18 17   Temp: 98.6 F (37 C) 98.5 F (36.9 C) 98.6 F (37 C) 98.7 F (37.1 C)  TempSrc: Oral Oral Oral Oral  SpO2: 99% 97% 94% 94%  Weight:      Height:       No intake  or output data in the 24 hours ending 09/28/20 1706 Filed Weights   09/28/20 0039  Weight: 97.5 kg    Examination:  General: A/O x4 No acute respiratory distress Eyes: negative scleral hemorrhage, negative anisocoria, negative icterus ENT: Negative Runny nose, negative gingival bleeding, Neck:  Negative scars, masses, torticollis, lymphadenopathy, JVD Lungs: Clear to auscultation bilaterally without wheezes or crackles Cardiovascular: Regular rate and rhythm without murmur gallop or rub normal S1 and S2 Abdomen: negative abdominal pain, nondistended, positive soft, bowel sounds, no rebound, no ascites, no appreciable mass Extremities: continued RIGHT minimal foot pain fifth metatarsal but improved.  Discoloration of RIGHT fifth  metatarsal improved (patient had picture on phone for comparison). Skin: Negative rashes, lesions, ulcers Psychiatric:  Negative depression, negative anxiety, negative fatigue, negative mania  Central nervous system:  Cranial nerves II through XII intact, tongue/uvula midline, all extremities muscle strength 5/5, sensation intact throughout, negative dysarthria, negative expressive aphasia, negative receptive aphasia.  .     Data Reviewed: Care during the described time interval was provided by me .  I have reviewed this patient's available data, including medical history, events of note, physical examination, and all test results as part of my evaluation.  CBC: Recent Labs  Lab 09/27/20 2006  WBC 8.6  NEUTROABS 6.1  HGB 15.3*  HCT 45.5  MCV 90.3  PLT 294   Basic Metabolic Panel: Recent Labs  Lab 09/27/20 2006  NA 136  K 3.2*  CL 101  CO2 27  GLUCOSE 108*  BUN 5*  CREATININE 0.65  CALCIUM 8.7*   GFR: Estimated Creatinine Clearance: 105.7 mL/min (by C-G formula based on SCr of 0.65 mg/dL). Liver Function Tests: Recent Labs  Lab 09/27/20 2006  AST 20  ALT 16  ALKPHOS 76  BILITOT 0.7  PROT 7.5  ALBUMIN 4.0   No results for input(s): LIPASE, AMYLASE in the last 168 hours. No results for input(s): AMMONIA in the last 168 hours. Coagulation Profile: No results for input(s): INR, PROTIME in the last 168 hours. Cardiac Enzymes: No results for input(s): CKTOTAL, CKMB, CKMBINDEX, TROPONINI in the last 168 hours. BNP (last 3 results) No results for input(s): PROBNP in the last 8760 hours. HbA1C: No results for input(s): HGBA1C in the last 72 hours. CBG: No results for input(s): GLUCAP in the last 168 hours. Lipid Profile: No results for input(s): CHOL, HDL, LDLCALC, TRIG, CHOLHDL, LDLDIRECT in the last 72 hours. Thyroid Function Tests: No results for input(s): TSH, T4TOTAL, FREET4, T3FREE, THYROIDAB in the last 72 hours. Anemia Panel: No results for input(s):  VITAMINB12, FOLATE, FERRITIN, TIBC, IRON, RETICCTPCT in the last 72 hours. Sepsis Labs: Recent Labs  Lab 09/27/20 2006 09/28/20 3149  LATICACIDVEN 1.1 0.8    Recent Results (from the past 240 hour(s))  SARS CORONAVIRUS 2 (TAT 6-24 HRS) Nasopharyngeal Nasopharyngeal Swab     Status: None   Collection Time: 09/27/20  9:31 PM   Specimen: Nasopharyngeal Swab  Result Value Ref Range Status   SARS Coronavirus 2 NEGATIVE NEGATIVE Final    Comment: (NOTE) SARS-CoV-2 target nucleic acids are NOT DETECTED.  The SARS-CoV-2 RNA is generally detectable in upper and lower respiratory specimens during the acute phase of infection. Negative results do not preclude SARS-CoV-2 infection, do not rule out co-infections with other pathogens, and should not be used as the sole basis for treatment or other patient management decisions. Negative results must be combined with clinical observations, patient history, and epidemiological information. The expected result is Negative.  Fact  Sheet for Patients: HairSlick.no  Fact Sheet for Healthcare Providers: quierodirigir.com  This test is not yet approved or cleared by the [place] FDA and  has been authorized for detection and/or diagnosis of SARS-CoV-2 by FDA under an Emergency Use Authorization (EUA). This EUA will remain  in effect (meaning this test can be used) for the duration of the COVID-19 declaration under Se ction 564(b)(1) of the Act, 21 U.S.C. section 360bbb-3(b)(1), unless the authorization is terminated or revoked sooner.  Performed at [facility], [address], [place], [place] [address]          Radiology Studies: No results found.      Scheduled Meds: . enoxaparin (LOVENOX) injection  0.5 mg/kg Subcutaneous Q24H  . iohexol  500 mL Oral Q1H  . nicotine  14 mg Transdermal Daily   Continuous Infusions:   LOS: 0 days    Time spent:40  min    WOODS, Roselind Messier, MD [facility]   If 7PM-7AM, please contact night-coverage 09/28/2020, 5:06 PM

## 2020-09-28 NOTE — Plan of Care (Signed)
New care plan initiated 

## 2020-09-29 ENCOUNTER — Other Ambulatory Visit (INDEPENDENT_AMBULATORY_CARE_PROVIDER_SITE_OTHER): Payer: Self-pay | Admitting: Vascular Surgery

## 2020-09-29 DIAGNOSIS — R1013 Epigastric pain: Secondary | ICD-10-CM

## 2020-09-29 LAB — COMPREHENSIVE METABOLIC PANEL
ALT: 15 U/L (ref 0–44)
AST: 22 U/L (ref 15–41)
Albumin: 3.3 g/dL — ABNORMAL LOW (ref 3.5–5.0)
Alkaline Phosphatase: 67 U/L (ref 38–126)
Anion gap: 8 (ref 5–15)
BUN: 7 mg/dL (ref 6–20)
CO2: 27 mmol/L (ref 22–32)
Calcium: 8.2 mg/dL — ABNORMAL LOW (ref 8.9–10.3)
Chloride: 102 mmol/L (ref 98–111)
Creatinine, Ser: 0.65 mg/dL (ref 0.44–1.00)
GFR, Estimated: >60 mL/min (ref >60)
Glucose, Bld: 88 mg/dL (ref 70–99)
Potassium: 3.1 mmol/L — ABNORMAL LOW (ref 3.5–5.1)
Sodium: 137 mmol/L (ref 135–145)
Total Bilirubin: 0.8 mg/dL (ref 0.3–1.2)
Total Protein: 6.6 g/dL (ref 6.5–8.1)

## 2020-09-29 LAB — CBC WITH DIFFERENTIAL/PLATELET
Abs Immature Granulocytes: 0.02 10*3/uL (ref 0.00–0.07)
Basophils Absolute: 0 10*3/uL (ref 0.0–0.1)
Basophils Relative: 1 %
Eosinophils Absolute: 0.3 10*3/uL (ref 0.0–0.5)
Eosinophils Relative: 4 %
HCT: 42.9 % (ref 36.0–46.0)
Hemoglobin: 14.6 g/dL (ref 12.0–15.0)
Immature Granulocytes: 0 %
Lymphocytes Relative: 37 %
Lymphs Abs: 2.5 10*3/uL (ref 0.7–4.0)
MCH: 30.5 pg (ref 26.0–34.0)
MCHC: 34 g/dL (ref 30.0–36.0)
MCV: 89.7 fL (ref 80.0–100.0)
Monocytes Absolute: 0.9 10*3/uL (ref 0.1–1.0)
Monocytes Relative: 13 %
Neutro Abs: 3.1 10*3/uL (ref 1.7–7.7)
Neutrophils Relative %: 45 %
Platelets: 271 10*3/uL (ref 150–400)
RBC: 4.78 MIL/uL (ref 3.87–5.11)
RDW: 13.6 % (ref 11.5–15.5)
WBC: 6.8 10*3/uL (ref 4.0–10.5)
nRBC: 0 % (ref 0.0–0.2)

## 2020-09-29 LAB — MAGNESIUM: Magnesium: 2 mg/dL (ref 1.7–2.4)

## 2020-09-29 LAB — OCCULT BLOOD X 1 CARD TO LAB, STOOL: Fecal Occult Bld: NEGATIVE

## 2020-09-29 LAB — PHOSPHORUS: Phosphorus: 3.7 mg/dL (ref 2.5–4.6)

## 2020-09-29 MED ORDER — SODIUM CHLORIDE 0.9 % IV SOLN
1.0000 g | INTRAVENOUS | Status: DC
  Administered 2020-09-29 – 2020-09-30 (×2): 1 g via INTRAVENOUS
  Filled 2020-09-29: qty 10
  Filled 2020-09-29 (×2): qty 1

## 2020-09-29 MED ORDER — CALCIUM CARBONATE ANTACID 500 MG PO CHEW
1.0000 | CHEWABLE_TABLET | Freq: Three times a day (TID) | ORAL | Status: DC | PRN
  Administered 2020-09-29: 200 mg via ORAL
  Filled 2020-09-29 (×2): qty 1

## 2020-09-29 MED ORDER — SODIUM CHLORIDE 0.9 % IV SOLN: INTRAVENOUS | Status: DC

## 2020-09-29 NOTE — Progress Notes (Addendum)
PROGRESS NOTE    Grace Evans   NLZ:[id number]  DOB: 04-Feb-1976  PCP: Lorn Junes, FNP    DOA: 09/27/2020 LOS: 1   Brief Narrative   45 y/o female with past medical history of GERD, nicotine abuse, hx of panic attacks, HLD,  who was seen in the ER on 2/6 for cellulitis of the right fifth toe treated with Rocephin, discharged with Keflex.  Patient returned to the ED on 09/27/20 with ongoing right fifth toe pain and darkening color with warm water contact.  She reported no improvement despite taking the oral antibiotics as prescribed.   In the ED, patient mildly tachycardic, afebrile.  Labs mostly unremarkable except mild hypokalemia 3.2.  No leukocytosis, normal lactic acid.  ED provider discussed case with vascular surgeon who recommended admission to the hospital for IV antibiotics.   Angiogram of the right lower extremity is planned.   Assessment & Plan   Active Problems:   Ischemic ulcer of toe of right foot, with unspecified severity (HCC)   Ischemic toe   Nicotine dependence, cigarettes, uncomplicated   Ischemic toe ulcer, right, with unspecified severity (HCC)   Watery diarrhea   Abdominal pain    Cellulitis of Right 5th toe / foot Ischemic ulceration of the right fifth toe -recently treated outpatient with Keflex for cellulitis after being seen in the ED on 2/6, no improvement.   --Rocephin for now, monitor closely --Vascular surgery consulted --Plan for right lower extremity angiogram with possible revascularization 2/23 --Continue maintenance IV fluids until after procedure  Right fifth toe discoloration -suspect is arterial/venous etiology.  Vascular is following as above.  Diarrhea -GI panel was positive for rotavirus infection.  Supportive care.  Monitor for dehydration.  Avoid antidiarrheals.  On maintenance IV fluids as above.  Epigastric abdominal pain -CT abdomen pelvis was obtained and showed findings consistent with mesenteritis.  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  No diverticular disease of the colon.  Supportive care.  Monitor.  Tobacco abuse -counseled on importance of cessation especially for vascular health and optimal circulation.  Nicotine patch ordered.  GERD -continue PPI  Obesity: Body mass index is 34.69 kg/m.  Complicates overall care and prognosis  DVT prophylaxis:    Diet:  Diet Orders (From admission, onward)    Start     Ordered   09/28/20 1522  Diet Heart Room service appropriate? Yes; Fluid consistency: Thin  Diet effective now       Question Answer Comment  Room service appropriate? Yes   Fluid consistency: Thin      09/28/20 1521            Code Status: Full Code    Subjective 09/29/20    Patient seen on rounds this morning.  Reports some episodes of sweating, denies chills.  Continues to have about 6 episodes of watery diarrhea daily.  Also reports some epigastric abdominal pain but no nausea vomiting this morning.  No other acute complaints.   Disposition Plan & Communication   Status is: Inpatient  Remains inpatient appropriate because:IV treatments appropriate due to intensity of illness or inability to take PO and ongoing diagnostic evaluation including angiogram.   Dispo: The patient is from: Home              Anticipated d/c is to: Home              Anticipated d/c date is: 2 days  Patient currently is not medically stable to d/c.   Difficult to place patient No   Consults, Procedures, Significant Events   Consultants:   Vascular surgery  Procedures:   Right lower extremity angiogram  Antimicrobials:  Anti-infectives (From admission, onward)   None        Micro    Objective   Vitals:   09/28/20 2352 09/29/20 0414 09/29/20 0553 09/29/20 0817  BP: 109/81 95/69 108/85 108/84  Pulse: 92 80 79 74  Resp: 17 16  18   Temp: 97.9 F (36.6 C) 97.6 F (36.4 C)  97.6 F (36.4 C)  TempSrc: Oral Oral    SpO2: 98% 97%  97%   Weight:      Height:       No intake or output data in the 24 hours ending 09/29/20 0914 Filed Weights   09/28/20 0039  Weight: 97.5 kg    Physical Exam:  General exam: awake, alert, no acute distress HEENT: atraumatic, clear conjunctiva, anicteric sclera, moist mucus membranes, hearing grossly normal  Respiratory system: CTAB, no wheezes, rales or rhonchi, normal respiratory effort. Cardiovascular system: normal S1/S2, RRR, no pedal edema.   Gastrointestinal system: soft, mild epigastric tenderness on palpation without guarding or rebound tenderness, hyperactive bowel sounds. Central nervous system: A&O x4. no gross focal neurologic deficits, normal speech Extremities: Right fifth toe is erythematous and tender on palpation with a small healing lesion laterally, no peripheral edema, normal tone Psychiatry: normal mood, congruent affect, judgement and insight appear normal  Labs   Data Reviewed: I have personally reviewed following labs and imaging studies  CBC: Recent Labs  Lab 09/27/20 2006 09/29/20 0626  WBC 8.6 6.8  NEUTROABS 6.1 3.1  HGB 15.3* 14.6  HCT 45.5 42.9  MCV 90.3 89.7  PLT 294 271   Basic Metabolic Panel: Recent Labs  Lab 09/27/20 2006 09/29/20 0626  NA 136 137  K 3.2* 3.1*  CL 101 102  CO2 27 27  GLUCOSE 108* 88  BUN 5* 7  CREATININE 0.65 0.65  CALCIUM 8.7* 8.2*  MG  --  2.0  PHOS  --  3.7   GFR: Estimated Creatinine Clearance: 105.7 mL/min (by C-G formula based on SCr of 0.65 mg/dL). Liver Function Tests: Recent Labs  Lab 09/27/20 2006 09/29/20 0626  AST 20 22  ALT 16 15  ALKPHOS 76 67  BILITOT 0.7 0.8  PROT 7.5 6.6  ALBUMIN 4.0 3.3*   Recent Labs  Lab 09/28/20 1658  LIPASE 20   No results for input(s): AMMONIA in the last 168 hours. Coagulation Profile: No results for input(s): INR, PROTIME in the last 168 hours. Cardiac Enzymes: No results for input(s): CKTOTAL, CKMB, CKMBINDEX, TROPONINI in the last 168 hours. BNP (last  3 results) No results for input(s): PROBNP in the last 8760 hours. HbA1C: No results for input(s): HGBA1C in the last 72 hours. CBG: No results for input(s): GLUCAP in the last 168 hours. Lipid Profile: No results for input(s): CHOL, HDL, LDLCALC, TRIG, CHOLHDL, LDLDIRECT in the last 72 hours. Thyroid Function Tests: Recent Labs    09/28/20 1658  TSH 0.586   Anemia Panel: No results for input(s): VITAMINB12, FOLATE, FERRITIN, TIBC, IRON, RETICCTPCT in the last 72 hours. Sepsis Labs: Recent Labs  Lab 09/27/20 2006 09/28/20 09/30/20  LATICACIDVEN 1.1 0.8    Recent Results (from the past 240 hour(s))  SARS CORONAVIRUS 2 (TAT 6-24 HRS) Nasopharyngeal Nasopharyngeal Swab     Status: None   Collection Time: 09/27/20  9:31 PM   Specimen: Nasopharyngeal Swab  Result Value Ref Range Status   SARS Coronavirus 2 NEGATIVE NEGATIVE Final    Comment: (NOTE) SARS-CoV-2 target nucleic acids are NOT DETECTED.  The SARS-CoV-2 RNA is generally detectable in upper and lower respiratory specimens during the acute phase of infection. Negative results do not preclude SARS-CoV-2 infection, do not rule out co-infections with other pathogens, and should not be used as the sole basis for treatment or other patient management decisions. Negative results must be combined with clinical observations, patient history, and epidemiological information. The expected result is Negative.  Fact Sheet for Patients: HairSlick.no  Fact Sheet for Healthcare Providers: quierodirigir.com  This test is not yet approved or cleared by the [place] FDA and  has been authorized for detection and/or diagnosis of SARS-CoV-2 by FDA under an Emergency Use Authorization (EUA). This EUA will remain  in effect (meaning this test can be used) for the duration of the COVID-19 declaration under Se ction 564(b)(1) of the Act, 21 U.S.C. section 360bbb-3(b)(1), unless the  authorization is terminated or revoked sooner.  Performed at [facility], [address] [place], [place] [address]   Gastrointestinal Panel by PCR , Stool     Status: Abnormal   Collection Time: 09/28/20  4:01 PM   Specimen: STOOL  Result Value Ref Range Status   Campylobacter species NOT DETECTED NOT DETECTED Final   Plesimonas shigelloides NOT DETECTED NOT DETECTED Final   Salmonella species NOT DETECTED NOT DETECTED Final   Yersinia enterocolitica NOT DETECTED NOT DETECTED Final   Vibrio species NOT DETECTED NOT DETECTED Final   Vibrio cholerae NOT DETECTED NOT DETECTED Final   Enteroaggregative E coli (EAEC) NOT DETECTED NOT DETECTED Final   Enteropathogenic E coli (EPEC) NOT DETECTED NOT DETECTED Final   Enterotoxigenic E coli (ETEC) NOT DETECTED NOT DETECTED Final   Shiga like toxin producing E coli (STEC) NOT DETECTED NOT DETECTED Final   Shigella/Enteroinvasive E coli (EIEC) NOT DETECTED NOT DETECTED Final   Cryptosporidium NOT DETECTED NOT DETECTED Final   Cyclospora cayetanensis NOT DETECTED NOT DETECTED Final   Entamoeba histolytica NOT DETECTED NOT DETECTED Final   Giardia lamblia NOT DETECTED NOT DETECTED Final   Adenovirus F40/41 NOT DETECTED NOT DETECTED Final   Astrovirus NOT DETECTED NOT DETECTED Final   Norovirus GI/GII NOT DETECTED NOT DETECTED Final   Rotavirus A DETECTED (A) NOT DETECTED Final   Sapovirus (I, II, IV, and V) NOT DETECTED NOT DETECTED Final    Comment: Performed at [facility], [address] [place], [place] [address]  C Difficile Quick Screen w PCR reflex     Status: None   Collection Time: 09/28/20  4:01 PM   Specimen: STOOL  Result Value Ref Range Status   C Diff antigen NEGATIVE NEGATIVE Final   C Diff toxin NEGATIVE NEGATIVE Final   C Diff interpretation No C. difficile detected.  Final    Comment: Performed at [facility], [address] [place], [place] [address]      Imaging Studies   CT ABDOMEN  PELVIS W CONTRAST  Result Date: 09/29/2020 CLINICAL DATA:  Watery diarrhea, abdominal pain, concern for diverticulosis. History of cholecystectomy. EXAM: CT ABDOMEN AND PELVIS WITH CONTRAST TECHNIQUE: Multidetector CT imaging of the abdomen and pelvis was performed using the standard protocol following bolus administration of intravenous contrast. CONTRAST:  OMNIPAQUE IOHEXOL 300 MG/ML  SOLN COMPARISON:  CT 06/20/2019 FINDINGS: Lower chest: Lung bases are clear. Normal  heart size. No pericardial effusion. Hepatobiliary: Diffuse hepatic hypoattenuation compatible with hepatic steatosis. No worrisome focal liver lesions. Smooth liver surface contour. Prior cholecystectomy. No biliary ductal dilatation or visible intraductal gallstones. Pancreas: No pancreatic ductal dilatation or surrounding inflammatory changes. Spleen: Normal in size. No concerning splenic lesions. Stable posterior splenic calcification, almost certainly benign. Adrenals/Urinary Tract: Normal adrenal glands. Kidneys are normally located with symmetric enhancement and excretion. No suspicious renal lesion, urolithiasis or hydronephrosis. Urinary bladder is largely decompressed at the time of exam and therefore poorly evaluated by CT imaging. Some faint perivesicular hazy stranding is present. Stomach/Bowel: Distal esophagus and stomach are unremarkable. Air-filled duodenal diverticulum, present on comparison. No associated inflammation. Duodenal sweep with normal course across the midline abdomen. No small bowel thickening or dilatation. High attenuation enteric contrast media traverses through most of the colon. No focal colonic thickening or dilatation is evident. Normal appendix seen in the right lower quadrant. Vascular/Lymphatic: Increasing conspicuity of a focal region of mid mesenteric hazy stranding with numerous reactive appearing clustered mid mesenteric lymph nodes compatible with mesenteritis (2/42). Atherosclerotic calcifications  within the abdominal aorta and branch vessels. No aneurysm or ectasia. No enlarged abdominopelvic lymph nodes. Reproductive: Retroverted and slightly retroflexed uterus. No concerning adnexal lesions. Other: No abdominopelvic free fluid or free gas. No bowel containing hernias. Redemonstration of a large ovoid intermediate attenuation lesion in the left inguinal region measuring approximately 5.6 x 5.5 cm, previously 5.4 x 5.3 cm common likely not significantly changed from the comparison exam accounting for differences in technique. Musculoskeletal: Mild degenerative changes in the spine. Features most pronounced L5-S1 with a small left central disc protrusion and facet arthropathy resulting in at least mild canal and foraminal narrowing. Additional minimal degenerative changes in the hips and pelvis. No acute osseous abnormality or suspicious osseous lesion. IMPRESSION: 1. 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 No significant colonic diverticular disease. 2. Lack of formed stool may reflect a rapid transit state/diarrheal illness. 3. Increasing conspicuity of a focal region of mid mesenteric hazy stranding with numerous reactive appearing clustered mid mesenteric lymph nodes compatible with mesenteritis. 4. Redemonstration of a ovoid intermediate attenuation lesion in the left inguinal region likely not significantly changed from the comparison exam accounting for differences in technique. Could reflect a complex or proteinaceous sebaceous cyst. If further imaging is clinically warranted, recommend utilizing ultrasound imaging. 5. Hepatic steatosis. 6. Prior cholecystectomy. 7. Aortic Atherosclerosis (ICD10-I70.0). Electronically Signed   By: Kreg ShropshirePrice  DeHay M.D.   On: 09/29/2020 00:14     Medications   Scheduled Meds: . enoxaparin (LOVENOX) injection  0.5 mg/kg Subcutaneous Q24H  . nicotine  14 mg Transdermal Daily   Continuous Infusions:     LOS: 1 day    Time spent: 30  minutes    Pennie BanterKelly A Mally Gavina, DO [facility]  09/29/2020, 9:14 AM      If 7PM-7AM, please contact night-coverage. How to contact the [facility] Attending or Consulting provider 7A - 7P or covering provider during after hours 7P -7A, for this patient?    1. Check the care team in [facility] and look for a) attending/consulting [facility] provider listed and b) the [facility] team listed 2. Log into [email or web address] and use [facility]'s universal password to access. If you do not have the password, please contact the hospital operator. 3. Locate the [facility] provider you are looking for under [facility] and page to a number that you can be directly reached. 4. If you still have difficulty reaching the provider, please page the  DOC (Director on Call) for the Hospitalists listed on amion for assistance.

## 2020-09-29 NOTE — Consult Note (Signed)
[facility] Vascular Consult Note  MRN : [id number]  Grace Evans is a 45 y.o. (May 04, 1976) female who presents with chief complaint of  Chief Complaint  Patient presents with  . Toe Pain   History of Present Illness:  Grace Evans is a 45 year old female with medical history significant for GERD and nicotine dependence, seen in the ER on 2/6 for cellulitis of the right fifth toe treated with antibiotics who returns to the ER with a complaint that her right fifth toe continues to be very painful and notices that when she puts her foot in warm water the toe turns very dark.  She has had no improvement with antibiotic therapy.  She denies fever or chills.  She has redness of the toe but with no streaking or swelling.  Vascular surgery was consulted by Dr. Para March for possible endovascular intervention.  Current Facility-Administered Medications  Medication Dose Route Frequency Provider Last Rate Last Admin  . acetaminophen (TYLENOL) tablet 650 mg  650 mg Oral Q6H PRN Andris Baumann, MD   650 mg at 09/28/20 2038   Or  . acetaminophen (TYLENOL) suppository 650 mg  650 mg Rectal Q6H PRN Andris Baumann, MD      . alum & mag hydroxide-simeth (MAALOX/MYLANTA) 200-200-20 MG/5ML suspension 30 mL  30 mL Oral Q6H PRN Drema Dallas, MD   30 mL at 09/28/20 2349  . enoxaparin (LOVENOX) injection 50 mg  0.5 mg/kg Subcutaneous Q24H Otelia Sergeant, RPH      . HYDROcodone-acetaminophen (NORCO/VICODIN) 5-325 MG per tablet 1-2 tablet  1-2 tablet Oral Q4H PRN Andris Baumann, MD      . ketorolac (TORADOL) 30 MG/ML injection 30 mg  30 mg Intravenous Q6H PRN Andris Baumann, MD   30 mg at 09/29/20 0554  . nicotine (NICODERM CQ - dosed in mg/24 hours) patch 14 mg  14 mg Transdermal Daily Lindajo Royal V, MD      . ondansetron [facility]) tablet 4 mg  4 mg Oral Q6H PRN Andris Baumann, MD       Or  . ondansetron [facility]) injection 4 mg  4 mg Intravenous Q6H PRN Andris Baumann, MD        Past Medical History:  Diagnosis Date  . GERD (gastroesophageal reflux disease)   . Hyperlipidemia    Past Surgical History:  Procedure Laterality Date  . CHOLECYSTECTOMY     Social History Social History   Tobacco Use  . Smoking status: Current Every Day Smoker    Packs/day: 1.00    Types: Cigarettes  . Smokeless tobacco: Never Used  Vaping Use  . Vaping Use: Never used  Substance Use Topics  . Alcohol use: No  . Drug use: No   Family History Family History  Problem Relation Age of Onset  . Thyroid cancer Mother   . Heart attack Mother   . Congestive Heart Failure Father   . Heart attack Father   . Hyperlipidemia Father   . Seizures Son   . Diabetes Maternal Grandmother   . Stroke Maternal Grandmother   . Stroke Maternal Grandfather   Family history of peripheral artery disease, venous disease or renal disease  No Known Allergies  REVIEW OF SYSTEMS (Negative unless checked)  Constitutional: [] Weight loss  [] Fever  [] Chills Cardiac: [] Chest pain   [] Chest pressure   [] Palpitations   [] Shortness of breath when laying flat   [] Shortness of breath at rest   [] Shortness  of breath with exertion. Vascular:  [] Pain in legs with walking   [] Pain in legs at rest   [] Pain in legs when laying flat   [] Claudication   [] Pain in feet when walking  [] Pain in feet at rest  [] Pain in feet when laying flat   [] History of DVT   [] Phlebitis   [x] Swelling in legs   [] Varicose veins   [] Non-healing ulcers Pulmonary:   [] Uses home oxygen   [] Productive cough   [] Hemoptysis   [] Wheeze  [] COPD   [] Asthma Neurologic:  [] Dizziness  [] Blackouts   [] Seizures   [] History of stroke   [] History of TIA  [] Aphasia   [] Temporary blindness   [] Dysphagia   [] Weakness or numbness in arms   [] Weakness or numbness in legs Musculoskeletal:  [x] Arthritis   [] Joint swelling   [] Joint pain   [] Low back pain Hematologic:  [] Easy bruising  [] Easy bleeding   [] Hypercoagulable state   [] Anemic   [] Hepatitis Gastrointestinal:  [] Blood in stool   [] Vomiting blood  [] Gastroesophageal reflux/heartburn   [] Difficulty swallowing. Genitourinary:  [x] Chronic kidney disease   [] Difficult urination  [] Frequent urination  [] Burning with urination   [] Blood in urine Skin:  [] Rashes   [] Ulcers   [] Wounds Psychological:  [] History of anxiety   []  History of major depression.  Physical Examination  Vitals:   09/29/20 0414 09/29/20 0553 09/29/20 0817 09/29/20 1108  BP: 95/69 108/85 108/84 103/70  Pulse: 80 79 74 73  Resp: 16  18 19   Temp: 97.6 F (36.4 C)  97.6 F (36.4 C) (!) 97.5 F (36.4 C)  TempSrc: Oral     SpO2: 97%  97% 99%  Weight:      Height:       Body mass index is 34.69 kg/m. Gen:  WD/WN, NAD Head: [place]/AT, No temporalis wasting. Prominent temp pulse not noted. Ear/Nose/Throat: Hearing grossly intact, nares w/o erythema or drainage, oropharynx w/o Erythema/Exudate Eyes: Sclera non-icteric, conjunctiva clear Neck: Trachea midline.  No JVD.  Pulmonary:  Good air movement, respirations not labored, equal bilaterally.  Cardiac: RRR, normal S1, S2. Vascular:  Vessel Right Left  Radial Palpable Palpable  Ulnar Palpable Palpable  Brachial Palpable Palpable  Carotid Palpable, without bruit Palpable, without bruit  Aorta Not palpable N/A  Femoral Palpable Palpable  Popliteal Palpable Palpable  PT Palpable Palpable  DP Palpable Palpable   Right lower extremity: Thigh soft.  Calf soft.  To medium distally toes.  Faintly palpable pedal pulses.  Discoloration to the fifth toe with cellulitis noted on exam.  Gastrointestinal: soft, non-tender/non-distended. No guarding/reflex.  Musculoskeletal: M/S 5/5 throughout.  Extremities without ischemic changes.  No deformity or atrophy. No edema. Neurologic: Sensation grossly intact in extremities.  Symmetrical.  Speech is fluent. Motor exam as listed above. Psychiatric: Judgment intact, Mood & affect appropriate for pt's clinical  situation. Dermatologic: No rashes or ulcers noted.  No cellulitis or open wounds. Lymph : No Cervical, Axillary, or Inguinal lymphadenopathy.  CBC Lab Results  Component Value Date   WBC 6.8 09/29/2020   HGB 14.6 09/29/2020   HCT 42.9 09/29/2020   MCV 89.7 09/29/2020   PLT 271 09/29/2020   BMET    Component Value Date/Time   NA 137 09/29/2020 0626   NA 138 10/31/2014 0032   K 3.1 (L) 09/29/2020 0626   K 3.7 10/31/2014 0032   CL 102 09/29/2020 0626   CL 107 10/31/2014 0032   CO2 27 09/29/2020 0626   CO2 28 10/31/2014 0032  GLUCOSE 88 09/29/2020 0626   GLUCOSE 105 (H) 10/31/2014 0032   BUN 7 09/29/2020 0626   BUN 5 (L) 10/31/2014 0032   CREATININE 0.65 09/29/2020 0626   CREATININE 0.74 10/31/2014 0032   CALCIUM 8.2 (L) 09/29/2020 0626   CALCIUM 8.5 (L) 10/31/2014 0032   GFRNONAA >60 09/29/2020 0626   GFRNONAA >60 10/31/2014 0032   GFRAA >60 06/19/2019 2122   GFRAA >60 10/31/2014 0032   Estimated Creatinine Clearance: 105.7 mL/min (by C-G formula based on SCr of 0.65 mg/dL).  COAG No results found for: INR, PROTIME  Radiology CT ABDOMEN PELVIS W CONTRAST  Result Date: 09/29/2020 CLINICAL DATA:  Watery diarrhea, abdominal pain, concern for diverticulosis. History of cholecystectomy. EXAM: CT ABDOMEN AND PELVIS WITH CONTRAST TECHNIQUE: Multidetector CT imaging of the abdomen and pelvis was performed using the standard protocol following bolus administration of intravenous contrast. CONTRAST:  100mL OMNIPAQUE IOHEXOL 300 MG/ML  SOLN COMPARISON:  CT 06/20/2019 FINDINGS: Lower chest: Lung bases are clear. Normal heart size. No pericardial effusion. Hepatobiliary: Diffuse hepatic hypoattenuation compatible with hepatic steatosis. No worrisome focal liver lesions. Smooth liver surface contour. Prior cholecystectomy. No biliary ductal dilatation or visible intraductal gallstones. Pancreas: No pancreatic ductal dilatation or surrounding inflammatory changes. Spleen: Normal in size.  No concerning splenic lesions. Stable posterior splenic calcification, almost certainly benign. Adrenals/Urinary Tract: Normal adrenal glands. Kidneys are normally located with symmetric enhancement and excretion. No suspicious renal lesion, urolithiasis or hydronephrosis. Urinary bladder is largely decompressed at the time of exam and therefore poorly evaluated by CT imaging. Some faint perivesicular hazy stranding is present. Stomach/Bowel: Distal esophagus and stomach are unremarkable. Air-filled duodenal diverticulum, present on comparison. No associated inflammation. Duodenal sweep with normal course across the midline abdomen. No small bowel thickening or dilatation. High attenuation enteric contrast media traverses through most of the colon. No focal colonic thickening or dilatation is evident. Normal appendix seen in the right lower quadrant. Vascular/Lymphatic: Increasing conspicuity of a focal region of mid mesenteric hazy stranding with numerous reactive appearing clustered mid mesenteric lymph nodes compatible with mesenteritis (2/42). Atherosclerotic calcifications within the abdominal aorta and branch vessels. No aneurysm or ectasia. No enlarged abdominopelvic lymph nodes. Reproductive: Retroverted and slightly retroflexed uterus. No concerning adnexal lesions. Other: No abdominopelvic free fluid or free gas. No bowel containing hernias. Redemonstration of a large ovoid intermediate attenuation lesion in the left inguinal region measuring approximately 5.6 x 5.5 cm, previously 5.4 x 5.3 cm common likely not significantly changed from the comparison exam accounting for differences in technique. Musculoskeletal: Mild degenerative changes in the spine. Features most pronounced L5-S1 with a small left central disc protrusion and facet arthropathy resulting in at least mild canal and foraminal narrowing. Additional minimal degenerative changes in the hips and pelvis. No acute osseous abnormality or  suspicious osseous lesion. IMPRESSION: 1. 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 No significant colonic diverticular disease. 2. Lack of formed stool may reflect a rapid transit state/diarrheal illness. 3. Increasing conspicuity of a focal region of mid mesenteric hazy stranding with numerous reactive appearing clustered mid mesenteric lymph nodes compatible with mesenteritis. 4. Redemonstration of a ovoid intermediate attenuation lesion in the left inguinal region likely not significantly changed from the comparison exam accounting for differences in technique. Could reflect a complex or proteinaceous sebaceous cyst. If further imaging is clinically warranted, recommend utilizing ultrasound imaging. 5. Hepatic steatosis. 6. Prior cholecystectomy. 7. Aortic Atherosclerosis (ICD10-I70.0). Electronically Signed   By: Price  DeHay M.D.   On: 09/29/2020 00:14     DG Toe 5th Right  Result Date: 09/12/2020 CLINICAL DATA:  Pain and swelling, infection EXAM: RIGHT FIFTH TOE COMPARISON:  None. FINDINGS: Frontal, oblique, lateral views of the right fifth digit are obtained. No acute or destructive bony lesions. Alignment appears anatomic. No focal soft tissue abnormalities. IMPRESSION: 1. Unremarkable right fifth digit. Electronically Signed   By: Sharlet Salina M.D.   On: 09/12/2020 15:34   MS DIGITAL DIAG TOMO BILAT  Result Date: 09/01/2020 CLINICAL DATA:  45 year old female presenting with diffuse intermittent left breast pain for approximately 1 week. EXAM: DIGITAL DIAGNOSTIC BILATERAL MAMMOGRAM WITH TOMO AND CAD TECHNIQUE: Bilateral digital diagnostic mammography and breast tomosynthesis was performed. Digital images of the breasts were evaluated with computer-aided detection. COMPARISON:  Previous exam(s). ACR Breast Density Category b: There are scattered areas of fibroglandular density. FINDINGS: Right breast: No suspicious mass, distortion, or microcalcifications are identified to suggest presence of  malignancy. Left breast: No suspicious mass, distortion, or microcalcifications are identified to suggest presence of malignancy. Specifically there is no new finding in the superior left breast to explain the patient's diffuse pain. IMPRESSION: No mammographic evidence of malignancy bilaterally or other finding to explain the patient's diffuse left breast pain. RECOMMENDATION: 1. Clinical follow-up as needed for the left breast pain. We discussed some of the issues regarding breast pain, including limiting caffeine, wearing adequate support, over-the-counter pain medication, low-fat diet, exercise, and ice as needed. 2.  Screening mammogram in one year.(Code:[id number]-B-01Y) I have discussed the findings and recommendations with the patient. If applicable, a reminder letter will be sent to the patient regarding the next appointment. BI-RADS CATEGORY  1: Negative. Electronically Signed   By: Emmaline Kluver M.D.   On: 09/01/2020 14:00   Assessment/Plan  1.  Recurrent cellulitis: Patient presents with a chief complaint of progressively worsening right lower extremity discomfort and swelling.  Patient notes recurrent cellulitis to the extremity.  I am able to faintly feel pedal pulses however in the setting of multiple risk factors for atherosclerotic disease, recurrent cellulitis, and discoloration to the toes recommend the patient undergo a right lower extremity angiogram possible intervention and attempt assess the patient's anatomy and contributing degree of atherosclerotic disease.  If appropriate, an attempt to revascularize leg can be made at that time.  Procedure, risks and benefits were explained to the patient.  All questions were answered.  The patient wishes to proceed.  We will plan on this on Thursday with Dr. Wyn Quaker.  2.  Right fifth toe discoloration: Could be a mixture of arterial and venous.  Patient will be undergoing a right lower extremity angiogram tomorrow with Dr. Wyn Quaker to assess arterial  patency.  We will also plan on undergoing a official venous work-up as an outpatient.  And is in agreement with plan  3.  Tobacco abuse: We had a discussion for approximately three minutes regarding the absolute need for smoking cessation due to the deleterious nature of tobacco on the vascular system. We discussed the tobacco use would diminish patency of any intervention, and likely significantly worsen progressio of disease. We discussed multiple agents for quitting including replacement therapy or medications to reduce cravings such as Chantix. The patient voices their understanding of the importance of smoking cessation.  4.  Hyperlipidemia: Consider aspirin and statin for medical management?  Discussed with Dr. Weldon Inches, PA-C  09/29/2020 12:02 PM  This note was created with Dragon medical transcription system.  Any error is purely unintentional

## 2020-09-29 NOTE — H&P (View-Only) (Signed)
[facility] Vascular Consult Note  MRN : [id number]  Grace Evans is a 45 y.o. (May 04, 1976) female who presents with chief complaint of  Chief Complaint  Patient presents with  . Toe Pain   History of Present Illness:  Grace Evans is a 45 year old female with medical history significant for GERD and nicotine dependence, seen in the ER on 2/6 for cellulitis of the right fifth toe treated with antibiotics who returns to the ER with a complaint that her right fifth toe continues to be very painful and notices that when she puts her foot in warm water the toe turns very dark.  She has had no improvement with antibiotic therapy.  She denies fever or chills.  She has redness of the toe but with no streaking or swelling.  Vascular surgery was consulted by Dr. Para March for possible endovascular intervention.  Current Facility-Administered Medications  Medication Dose Route Frequency Provider Last Rate Last Admin  . acetaminophen (TYLENOL) tablet 650 mg  650 mg Oral Q6H PRN Andris Baumann, MD   650 mg at 09/28/20 2038   Or  . acetaminophen (TYLENOL) suppository 650 mg  650 mg Rectal Q6H PRN Andris Baumann, MD      . alum & mag hydroxide-simeth (MAALOX/MYLANTA) 200-200-20 MG/5ML suspension 30 mL  30 mL Oral Q6H PRN Drema Dallas, MD   30 mL at 09/28/20 2349  . enoxaparin (LOVENOX) injection 50 mg  0.5 mg/kg Subcutaneous Q24H Otelia Sergeant, RPH      . HYDROcodone-acetaminophen (NORCO/VICODIN) 5-325 MG per tablet 1-2 tablet  1-2 tablet Oral Q4H PRN Andris Baumann, MD      . ketorolac (TORADOL) 30 MG/ML injection 30 mg  30 mg Intravenous Q6H PRN Andris Baumann, MD   30 mg at 09/29/20 0554  . nicotine (NICODERM CQ - dosed in mg/24 hours) patch 14 mg  14 mg Transdermal Daily Lindajo Royal V, MD      . ondansetron [facility]) tablet 4 mg  4 mg Oral Q6H PRN Andris Baumann, MD       Or  . ondansetron [facility]) injection 4 mg  4 mg Intravenous Q6H PRN Andris Baumann, MD        Past Medical History:  Diagnosis Date  . GERD (gastroesophageal reflux disease)   . Hyperlipidemia    Past Surgical History:  Procedure Laterality Date  . CHOLECYSTECTOMY     Social History Social History   Tobacco Use  . Smoking status: Current Every Day Smoker    Packs/day: 1.00    Types: Cigarettes  . Smokeless tobacco: Never Used  Vaping Use  . Vaping Use: Never used  Substance Use Topics  . Alcohol use: No  . Drug use: No   Family History Family History  Problem Relation Age of Onset  . Thyroid cancer Mother   . Heart attack Mother   . Congestive Heart Failure Father   . Heart attack Father   . Hyperlipidemia Father   . Seizures Son   . Diabetes Maternal Grandmother   . Stroke Maternal Grandmother   . Stroke Maternal Grandfather   Family history of peripheral artery disease, venous disease or renal disease  No Known Allergies  REVIEW OF SYSTEMS (Negative unless checked)  Constitutional: [] Weight loss  [] Fever  [] Chills Cardiac: [] Chest pain   [] Chest pressure   [] Palpitations   [] Shortness of breath when laying flat   [] Shortness of breath at rest   [] Shortness  of breath with exertion. Vascular:  [] Pain in legs with walking   [] Pain in legs at rest   [] Pain in legs when laying flat   [] Claudication   [] Pain in feet when walking  [] Pain in feet at rest  [] Pain in feet when laying flat   [] History of DVT   [] Phlebitis   [x] Swelling in legs   [] Varicose veins   [] Non-healing ulcers Pulmonary:   [] Uses home oxygen   [] Productive cough   [] Hemoptysis   [] Wheeze  [] COPD   [] Asthma Neurologic:  [] Dizziness  [] Blackouts   [] Seizures   [] History of stroke   [] History of TIA  [] Aphasia   [] Temporary blindness   [] Dysphagia   [] Weakness or numbness in arms   [] Weakness or numbness in legs Musculoskeletal:  [x] Arthritis   [] Joint swelling   [] Joint pain   [] Low back pain Hematologic:  [] Easy bruising  [] Easy bleeding   [] Hypercoagulable state   [] Anemic   [] Hepatitis Gastrointestinal:  [] Blood in stool   [] Vomiting blood  [] Gastroesophageal reflux/heartburn   [] Difficulty swallowing. Genitourinary:  [x] Chronic kidney disease   [] Difficult urination  [] Frequent urination  [] Burning with urination   [] Blood in urine Skin:  [] Rashes   [] Ulcers   [] Wounds Psychological:  [] History of anxiety   []  History of major depression.  Physical Examination  Vitals:   09/29/20 0414 09/29/20 0553 09/29/20 0817 09/29/20 1108  BP: 95/69 108/85 108/84 103/70  Pulse: 80 79 74 73  Resp: 16  18 19   Temp: 97.6 F (36.4 C)  97.6 F (36.4 C) (!) 97.5 F (36.4 C)  TempSrc: Oral     SpO2: 97%  97% 99%  Weight:      Height:       Body mass index is 34.69 kg/m. Gen:  WD/WN, NAD Head: [place]/AT, No temporalis wasting. Prominent temp pulse not noted. Ear/Nose/Throat: Hearing grossly intact, nares w/o erythema or drainage, oropharynx w/o Erythema/Exudate Eyes: Sclera non-icteric, conjunctiva clear Neck: Trachea midline.  No JVD.  Pulmonary:  Good air movement, respirations not labored, equal bilaterally.  Cardiac: RRR, normal S1, S2. Vascular:  Vessel Right Left  Radial Palpable Palpable  Ulnar Palpable Palpable  Brachial Palpable Palpable  Carotid Palpable, without bruit Palpable, without bruit  Aorta Not palpable N/A  Femoral Palpable Palpable  Popliteal Palpable Palpable  PT Palpable Palpable  DP Palpable Palpable   Right lower extremity: Thigh soft.  Calf soft.  To medium distally toes.  Faintly palpable pedal pulses.  Discoloration to the fifth toe with cellulitis noted on exam.  Gastrointestinal: soft, non-tender/non-distended. No guarding/reflex.  Musculoskeletal: M/S 5/5 throughout.  Extremities without ischemic changes.  No deformity or atrophy. No edema. Neurologic: Sensation grossly intact in extremities.  Symmetrical.  Speech is fluent. Motor exam as listed above. Psychiatric: Judgment intact, Mood & affect appropriate for pt's clinical  situation. Dermatologic: No rashes or ulcers noted.  No cellulitis or open wounds. Lymph : No Cervical, Axillary, or Inguinal lymphadenopathy.  CBC Lab Results  Component Value Date   WBC 6.8 09/29/2020   HGB 14.6 09/29/2020   HCT 42.9 09/29/2020   MCV 89.7 09/29/2020   PLT 271 09/29/2020   BMET    Component Value Date/Time   NA 137 09/29/2020 0626   NA 138 10/31/2014 0032   K 3.1 (L) 09/29/2020 0626   K 3.7 10/31/2014 0032   CL 102 09/29/2020 0626   CL 107 10/31/2014 0032   CO2 27 09/29/2020 0626   CO2 28 10/31/2014 0032  GLUCOSE 88 09/29/2020 0626   GLUCOSE 105 (H) 10/31/2014 0032   BUN 7 09/29/2020 0626   BUN 5 (L) 10/31/2014 0032   CREATININE 0.65 09/29/2020 0626   CREATININE 0.74 10/31/2014 0032   CALCIUM 8.2 (L) 09/29/2020 0626   CALCIUM 8.5 (L) 10/31/2014 0032   GFRNONAA >60 09/29/2020 0626   GFRNONAA >60 10/31/2014 0032   GFRAA >60 06/19/2019 2122   GFRAA >60 10/31/2014 0032   Estimated Creatinine Clearance: 105.7 mL/min (by C-G formula based on SCr of 0.65 mg/dL).  COAG No results found for: INR, PROTIME  Radiology CT ABDOMEN PELVIS W CONTRAST  Result Date: 09/29/2020 CLINICAL DATA:  Watery diarrhea, abdominal pain, concern for diverticulosis. History of cholecystectomy. EXAM: CT ABDOMEN AND PELVIS WITH CONTRAST TECHNIQUE: Multidetector CT imaging of the abdomen and pelvis was performed using the standard protocol following bolus administration of intravenous contrast. CONTRAST:  100mL OMNIPAQUE IOHEXOL 300 MG/ML  SOLN COMPARISON:  CT 06/20/2019 FINDINGS: Lower chest: Lung bases are clear. Normal heart size. No pericardial effusion. Hepatobiliary: Diffuse hepatic hypoattenuation compatible with hepatic steatosis. No worrisome focal liver lesions. Smooth liver surface contour. Prior cholecystectomy. No biliary ductal dilatation or visible intraductal gallstones. Pancreas: No pancreatic ductal dilatation or surrounding inflammatory changes. Spleen: Normal in size.  No concerning splenic lesions. Stable posterior splenic calcification, almost certainly benign. Adrenals/Urinary Tract: Normal adrenal glands. Kidneys are normally located with symmetric enhancement and excretion. No suspicious renal lesion, urolithiasis or hydronephrosis. Urinary bladder is largely decompressed at the time of exam and therefore poorly evaluated by CT imaging. Some faint perivesicular hazy stranding is present. Stomach/Bowel: Distal esophagus and stomach are unremarkable. Air-filled duodenal diverticulum, present on comparison. No associated inflammation. Duodenal sweep with normal course across the midline abdomen. No small bowel thickening or dilatation. High attenuation enteric contrast media traverses through most of the colon. No focal colonic thickening or dilatation is evident. Normal appendix seen in the right lower quadrant. Vascular/Lymphatic: Increasing conspicuity of a focal region of mid mesenteric hazy stranding with numerous reactive appearing clustered mid mesenteric lymph nodes compatible with mesenteritis (2/42). Atherosclerotic calcifications within the abdominal aorta and branch vessels. No aneurysm or ectasia. No enlarged abdominopelvic lymph nodes. Reproductive: Retroverted and slightly retroflexed uterus. No concerning adnexal lesions. Other: No abdominopelvic free fluid or free gas. No bowel containing hernias. Redemonstration of a large ovoid intermediate attenuation lesion in the left inguinal region measuring approximately 5.6 x 5.5 cm, previously 5.4 x 5.3 cm common likely not significantly changed from the comparison exam accounting for differences in technique. Musculoskeletal: Mild degenerative changes in the spine. Features most pronounced L5-S1 with a small left central disc protrusion and facet arthropathy resulting in at least mild canal and foraminal narrowing. Additional minimal degenerative changes in the hips and pelvis. No acute osseous abnormality or  suspicious osseous lesion. IMPRESSION: 1. 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 No significant colonic diverticular disease. 2. Lack of formed stool may reflect a rapid transit state/diarrheal illness. 3. Increasing conspicuity of a focal region of mid mesenteric hazy stranding with numerous reactive appearing clustered mid mesenteric lymph nodes compatible with mesenteritis. 4. Redemonstration of a ovoid intermediate attenuation lesion in the left inguinal region likely not significantly changed from the comparison exam accounting for differences in technique. Could reflect a complex or proteinaceous sebaceous cyst. If further imaging is clinically warranted, recommend utilizing ultrasound imaging. 5. Hepatic steatosis. 6. Prior cholecystectomy. 7. Aortic Atherosclerosis (ICD10-I70.0). Electronically Signed   By: Kreg ShropshirePrice  DeHay M.D.   On: 09/29/2020 00:14  DG Toe 5th Right  Result Date: 09/12/2020 CLINICAL DATA:  Pain and swelling, infection EXAM: RIGHT FIFTH TOE COMPARISON:  None. FINDINGS: Frontal, oblique, lateral views of the right fifth digit are obtained. No acute or destructive bony lesions. Alignment appears anatomic. No focal soft tissue abnormalities. IMPRESSION: 1. Unremarkable right fifth digit. Electronically Signed   By: Sharlet Salina M.D.   On: 09/12/2020 15:34   MS DIGITAL DIAG TOMO BILAT  Result Date: 09/01/2020 CLINICAL DATA:  45 year old female presenting with diffuse intermittent left breast pain for approximately 1 week. EXAM: DIGITAL DIAGNOSTIC BILATERAL MAMMOGRAM WITH TOMO AND CAD TECHNIQUE: Bilateral digital diagnostic mammography and breast tomosynthesis was performed. Digital images of the breasts were evaluated with computer-aided detection. COMPARISON:  Previous exam(s). ACR Breast Density Category b: There are scattered areas of fibroglandular density. FINDINGS: Right breast: No suspicious mass, distortion, or microcalcifications are identified to suggest presence of  malignancy. Left breast: No suspicious mass, distortion, or microcalcifications are identified to suggest presence of malignancy. Specifically there is no new finding in the superior left breast to explain the patient's diffuse pain. IMPRESSION: No mammographic evidence of malignancy bilaterally or other finding to explain the patient's diffuse left breast pain. RECOMMENDATION: 1. Clinical follow-up as needed for the left breast pain. We discussed some of the issues regarding breast pain, including limiting caffeine, wearing adequate support, over-the-counter pain medication, low-fat diet, exercise, and ice as needed. 2.  Screening mammogram in one year.(Code:[id number]-B-01Y) I have discussed the findings and recommendations with the patient. If applicable, a reminder letter will be sent to the patient regarding the next appointment. BI-RADS CATEGORY  1: Negative. Electronically Signed   By: Emmaline Kluver M.D.   On: 09/01/2020 14:00   Assessment/Plan  1.  Recurrent cellulitis: Patient presents with a chief complaint of progressively worsening right lower extremity discomfort and swelling.  Patient notes recurrent cellulitis to the extremity.  I am able to faintly feel pedal pulses however in the setting of multiple risk factors for atherosclerotic disease, recurrent cellulitis, and discoloration to the toes recommend the patient undergo a right lower extremity angiogram possible intervention and attempt assess the patient's anatomy and contributing degree of atherosclerotic disease.  If appropriate, an attempt to revascularize leg can be made at that time.  Procedure, risks and benefits were explained to the patient.  All questions were answered.  The patient wishes to proceed.  We will plan on this on Thursday with Dr. Wyn Quaker.  2.  Right fifth toe discoloration: Could be a mixture of arterial and venous.  Patient will be undergoing a right lower extremity angiogram tomorrow with Dr. Wyn Quaker to assess arterial  patency.  We will also plan on undergoing a official venous work-up as an outpatient.  And is in agreement with plan  3.  Tobacco abuse: We had a discussion for approximately three minutes regarding the absolute need for smoking cessation due to the deleterious nature of tobacco on the vascular system. We discussed the tobacco use would diminish patency of any intervention, and likely significantly worsen progressio of disease. We discussed multiple agents for quitting including replacement therapy or medications to reduce cravings such as Chantix. The patient voices their understanding of the importance of smoking cessation.  4.  Hyperlipidemia: Consider aspirin and statin for medical management?  Discussed with Dr. Weldon Inches, PA-C  09/29/2020 12:02 PM  This note was created with Dragon medical transcription system.  Any error is purely unintentional

## 2020-09-29 NOTE — Hospital Course (Addendum)
Summary of HPI on admission: 45 y/o female with past medical history of GERD, nicotine abuse, hx of panic attacks, HLD,  who was seen in the ER on 2/6 for cellulitis of the right fifth toe treated with Rocephin, discharged with Keflex.  Patient returned to the ED on 09/27/20 with ongoing right fifth toe pain and darkening color with warm water contact.  She reported no improvement despite taking the oral antibiotics as prescribed.   In the ED, patient mildly tachycardic, afebrile.  Labs mostly unremarkable except mild hypokalemia 3.2.  No leukocytosis, normal lactic acid.  ED provider discussed case with vascular surgeon who recommended admission to the hospital with plan for angiogram of the right lower extremity.  Hospital course to date: Treated with Rocephin for cellulitis involving R 5th toe. GI panel positive for Rotavirus.  Diarrhea improved with supportive care.  2/24: Underwent aortogram and right lower extremity angiogram, infrarenal aortic stent was placed.

## 2020-09-30 ENCOUNTER — Encounter
Admission: EM | Disposition: A | Discharge status: Home / Self Care | Payer: Self-pay | Source: Home / Self Care | Attending: Internal Medicine

## 2020-09-30 DIAGNOSIS — I70234 Atherosclerosis of native arteries of right leg with ulceration of heel and midfoot: Secondary | ICD-10-CM

## 2020-09-30 HISTORY — PX: LOWER EXTREMITY ANGIOGRAPHY: CATH118251

## 2020-09-30 LAB — BASIC METABOLIC PANEL
Anion gap: 8 (ref 5–15)
BUN: 6 mg/dL (ref 6–20)
CO2: 28 mmol/L (ref 22–32)
Calcium: 8.2 mg/dL — ABNORMAL LOW (ref 8.9–10.3)
Chloride: 103 mmol/L (ref 98–111)
Creatinine, Ser: 0.69 mg/dL (ref 0.44–1.00)
GFR, Estimated: >60 mL/min (ref >60)
Glucose, Bld: 83 mg/dL (ref 70–99)
Potassium: 3.6 mmol/L (ref 3.5–5.1)
Sodium: 139 mmol/L (ref 135–145)

## 2020-09-30 LAB — CBC WITH DIFFERENTIAL/PLATELET
Abs Immature Granulocytes: 0.01 10*3/uL (ref 0.00–0.07)
Basophils Absolute: 0 10*3/uL (ref 0.0–0.1)
Basophils Relative: 1 %
Eosinophils Absolute: 0.3 10*3/uL (ref 0.0–0.5)
Eosinophils Relative: 4 %
HCT: 40.1 % (ref 36.0–46.0)
Hemoglobin: 14 g/dL (ref 12.0–15.0)
Immature Granulocytes: 0 %
Lymphocytes Relative: 40 %
Lymphs Abs: 2.3 10*3/uL (ref 0.7–4.0)
MCH: 31.3 pg (ref 26.0–34.0)
MCHC: 34.9 g/dL (ref 30.0–36.0)
MCV: 89.7 fL (ref 80.0–100.0)
Monocytes Absolute: 0.6 10*3/uL (ref 0.1–1.0)
Monocytes Relative: 10 %
Neutro Abs: 2.6 10*3/uL (ref 1.7–7.7)
Neutrophils Relative %: 45 %
Platelets: 232 10*3/uL (ref 150–400)
RBC: 4.47 MIL/uL (ref 3.87–5.11)
RDW: 13.5 % (ref 11.5–15.5)
WBC: 5.9 10*3/uL (ref 4.0–10.5)
nRBC: 0 % (ref 0.0–0.2)

## 2020-09-30 LAB — NOROVIRUS GROUP 1 & 2 BY PCR, STOOL
Norovirus 1 by PCR: NEGATIVE
Norovirus 2  by PCR: NEGATIVE

## 2020-09-30 LAB — PHOSPHORUS: Phosphorus: 4.3 mg/dL (ref 2.5–4.6)

## 2020-09-30 LAB — MAGNESIUM: Magnesium: 2 mg/dL (ref 1.7–2.4)

## 2020-09-30 SURGERY — LOWER EXTREMITY ANGIOGRAPHY
Anesthesia: Moderate Sedation | Laterality: Right

## 2020-09-30 MED ORDER — ONDANSETRON HCL 4 MG/2ML IJ SOLN
INTRAMUSCULAR | Status: AC
  Administered 2020-09-30: 4 mg via INTRAVENOUS
  Filled 2020-09-30: qty 2

## 2020-09-30 MED ORDER — FENTANYL CITRATE (PF) 100 MCG/2ML IJ SOLN
INTRAMUSCULAR | Status: AC
  Administered 2020-09-30: 50 ug
  Filled 2020-09-30: qty 2

## 2020-09-30 MED ORDER — HEPARIN SODIUM (PORCINE) 1000 UNIT/ML IJ SOLN
INTRAMUSCULAR | Status: AC
  Filled 2020-09-30: qty 1

## 2020-09-30 MED ORDER — ATORVASTATIN CALCIUM 10 MG PO TABS
10.0000 mg | ORAL_TABLET | Freq: Every day | ORAL | Status: DC
  Administered 2020-09-30 – 2020-10-01 (×2): 10 mg via ORAL
  Filled 2020-09-30 (×2): qty 1

## 2020-09-30 MED ORDER — DIPHENHYDRAMINE HCL 50 MG/ML IJ SOLN: 50.0000 mg | Freq: Once | INTRAMUSCULAR | Status: DC | PRN

## 2020-09-30 MED ORDER — ASPIRIN EC 81 MG PO TBEC
81.0000 mg | DELAYED_RELEASE_TABLET | Freq: Every day | ORAL | Status: DC
  Administered 2020-09-30 – 2020-10-01 (×2): 81 mg via ORAL
  Filled 2020-09-30 (×2): qty 1

## 2020-09-30 MED ORDER — MIDAZOLAM HCL 2 MG/2ML IJ SOLN
INTRAMUSCULAR | Status: DC | PRN
  Administered 2020-09-30: 1 mg via INTRAVENOUS

## 2020-09-30 MED ORDER — CEFAZOLIN SODIUM-DEXTROSE 2-4 GM/100ML-% IV SOLN
INTRAVENOUS | Status: AC
  Administered 2020-09-30: 2 g
  Filled 2020-09-30: qty 100

## 2020-09-30 MED ORDER — HYDROMORPHONE HCL 1 MG/ML IJ SOLN: 1.0000 mg | Freq: Once | INTRAMUSCULAR | Status: DC | PRN

## 2020-09-30 MED ORDER — IODIXANOL 320 MG/ML IV SOLN
INTRAVENOUS | Status: DC | PRN
  Administered 2020-09-30: 80 mL

## 2020-09-30 MED ORDER — FAMOTIDINE 20 MG PO TABS: 40.0000 mg | ORAL_TABLET | Freq: Once | ORAL | Status: DC | PRN

## 2020-09-30 MED ORDER — CLOPIDOGREL BISULFATE 75 MG PO TABS
75.0000 mg | ORAL_TABLET | Freq: Every day | ORAL | Status: DC
  Administered 2020-09-30 – 2020-10-01 (×2): 75 mg via ORAL
  Filled 2020-09-30 (×2): qty 1

## 2020-09-30 MED ORDER — ONDANSETRON HCL 4 MG/2ML IJ SOLN: 4.0000 mg | Freq: Once | INTRAMUSCULAR | Status: AC

## 2020-09-30 MED ORDER — METHYLPREDNISOLONE SODIUM SUCC 125 MG IJ SOLR: 125.0000 mg | Freq: Once | INTRAMUSCULAR | Status: DC | PRN

## 2020-09-30 MED ORDER — CEFAZOLIN SODIUM-DEXTROSE 2-4 GM/100ML-% IV SOLN: 2.0000 g | Freq: Once | INTRAVENOUS | Status: DC

## 2020-09-30 MED ORDER — SODIUM CHLORIDE 0.9 % IV SOLN: INTRAVENOUS | Status: DC

## 2020-09-30 MED ORDER — MIDAZOLAM HCL 5 MG/5ML IJ SOLN
INTRAMUSCULAR | Status: AC
  Administered 2020-09-30: 2 mg
  Filled 2020-09-30: qty 5

## 2020-09-30 MED ORDER — ONDANSETRON HCL 4 MG/2ML IJ SOLN: 4.0000 mg | Freq: Four times a day (QID) | INTRAMUSCULAR | Status: DC | PRN

## 2020-09-30 MED ORDER — HEPARIN SODIUM (PORCINE) 1000 UNIT/ML IJ SOLN
INTRAMUSCULAR | Status: DC | PRN
  Administered 2020-09-30: 4000 [IU] via INTRAVENOUS

## 2020-09-30 MED ORDER — MIDAZOLAM HCL 2 MG/ML PO SYRP: 8.0000 mg | ORAL_SOLUTION | Freq: Once | ORAL | Status: DC | PRN

## 2020-09-30 MED ORDER — FENTANYL CITRATE (PF) 100 MCG/2ML IJ SOLN
INTRAMUSCULAR | Status: DC | PRN
  Administered 2020-09-30: 25 ug via INTRAVENOUS

## 2020-09-30 SURGICAL SUPPLY — 16 items
BALLN ARMADA 14X40X80 (BALLOONS) ×2
BALLN ULTRVRSE 10X60X75 (BALLOONS) ×2
BALLOON ARMADA 14X40X80 (BALLOONS) ×1 IMPLANT
BALLOON ULTRVRSE 10X60X75 (BALLOONS) ×1 IMPLANT
CATH ANGIO 5F PIGTAIL 65CM (CATHETERS) ×2 IMPLANT
DEVICE STARCLOSE SE CLOSURE (Vascular Products) ×2 IMPLANT
GLIDEWIRE ADV .035X260CM (WIRE) ×2 IMPLANT
KIT ENCORE 26 ADVANTAGE (KITS) ×2 IMPLANT
PACK ANGIOGRAPHY (CUSTOM PROCEDURE TRAY) ×2 IMPLANT
SHEATH BRITE TIP 5FRX11 (SHEATH) ×2 IMPLANT
SHEATH BRITE TIP 6FRX11 (SHEATH) ×2 IMPLANT
SHEATH BRITE TIP 7FRX11 (SHEATH) ×2 IMPLANT
STENT LIFESTAR 14X60 (Permanent Stent) ×2 IMPLANT
SYR MEDRAD MARK 7 150ML (SYRINGE) ×2 IMPLANT
TUBING CONTRAST HIGH PRESS 72 (TUBING) ×2 IMPLANT
WIRE GUIDERIGHT .035X150 (WIRE) ×2 IMPLANT

## 2020-09-30 NOTE — Interval H&P Note (Signed)
History and Physical Interval Note:  09/30/2020 8:26 AM  Grace Evans  has presented today for surgery, with the diagnosis of Recurrent cellulitis.  The various methods of treatment have been discussed with the patient and family. After consideration of risks, benefits and other options for treatment, the patient has consented to  Procedure(s): Lower Extremity Angiography (Right) as a surgical intervention.  The patient's history has been reviewed, patient examined, no change in status, stable for surgery.  I have reviewed the patient's chart and labs.  Questions were answered to the patient's satisfaction.     Festus Barren

## 2020-09-30 NOTE — Progress Notes (Addendum)
PROGRESS NOTE    Grace Evans   XTK:[id number]  DOB: 1976-05-17  PCP: Lorn Junes, FNP    DOA: 09/27/2020 LOS: 2   Brief Narrative   45 y/o female with past medical history of GERD, nicotine abuse, hx of panic attacks, HLD,  who was seen in the ER on 2/6 for cellulitis of the right fifth toe treated with Rocephin, discharged with Keflex.  Patient returned to the ED on 09/27/20 with ongoing right fifth toe pain and darkening color with warm water contact.  She reported no improvement despite taking the oral antibiotics as prescribed.   In the ED, patient mildly tachycardic, afebrile.  Labs mostly unremarkable except mild hypokalemia 3.2.  No leukocytosis, normal lactic acid.  ED provider discussed case with vascular surgeon who recommended admission to the hospital and plan for angiogram of the right lower extremity.   Assessment & Plan   Active Problems:   Ischemic ulcer of toe of right foot, with unspecified severity (HCC)   Ischemic toe   Nicotine dependence, cigarettes, uncomplicated   Ischemic toe ulcer, right, with unspecified severity (HCC)   Watery diarrhea   Abdominal pain    Cellulitis of Right 5th toe / foot Ischemic ulceration of the right fifth toe -recently treated outpatient with Keflex for cellulitis after being seen in the ED on 2/6, no improvement.   2/24: Underwent aortogram and right lower extremity angiogram, infrarenal aortic stent was placed --Continue Rocephin  --Vascular surgery consulted, follow-up recommendations post procedure --Continue maintenance IV fluids given contrast load today, stop tomorrow  Vascular disease -aortic stent placed as above. --Continue aspirin, Plavix, Lipitor --Lipid panel with a.m. labs  Right fifth toe discoloration -suspect is arterial/venous etiology.  Vascular is following as above.  Diarrhea -improved.  GI panel was positive for rotavirus infection.  Supportive care.  Monitor for dehydration.  Avoid  antidiarrheals.   On maintenance IV fluids as above.  Epigastric abdominal pain -CT abdomen pelvis was obtained and showed findings consistent with mesenteritis.  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  No diverticular disease of the colon.  Supportive care.  Monitor.  Tobacco abuse -counseled on importance of cessation especially for vascular health and optimal circulation.  Nicotine patch ordered.  GERD -continue PPI  Obesity: Body mass index is 34.69 kg/m.  Complicates overall care and prognosis  DVT prophylaxis: Lovenox   Diet:  Diet Orders (From admission, onward)    Start     Ordered   09/30/20 1043  Diet regular Room service appropriate? Yes; Fluid consistency: Thin  Diet effective now       Question Answer Comment  Room service appropriate? Yes   Fluid consistency: Thin      09/30/20 1042            Code Status: Full Code    Subjective 09/30/20    Patient seen after undergoing angiography this morning.  She had a stent placed to her aorta and currently complaining of severe low back pain.  Denies fevers chills.  Diarrhea is improved.  No other acute complaints.   Disposition Plan & Communication   Status is: Inpatient  Remains inpatient appropriate because:IV treatments appropriate due to intensity of illness or inability to take PO and ongoing diagnostic evaluation including angiogram.  Discharge pending clearance by vascular surgery.   Dispo: The patient is from: Home              Anticipated d/c is to: Home  Anticipated d/c date is: 1 to 2 days              Patient currently is not medically stable to d/c.   Difficult to place patient No   Consults, Procedures, Significant Events   Consultants:   Vascular surgery  Procedures:   Right lower extremity angiogram  Antimicrobials:  Anti-infectives (From admission, onward)   Start     Dose/Rate Route Frequency Ordered Stop   09/30/20 0800  ceFAZolin (ANCEF)  IVPB 2g/100 mL premix  Status:  Discontinued       Note to Pharmacy: To be given in specials   2 g 200 mL/hr over 30 Minutes Intravenous  Once 09/30/20 0755 09/30/20 1028   09/30/20 0759  ceFAZolin (ANCEF) 2-4 GM/100ML-% IVPB       Note to Pharmacy: Maynor, Erin   : cabinet override      09/30/20 0759 09/30/20 0826   09/29/20 1800  cefTRIAXone (ROCEPHIN) 1 g in sodium chloride 0.9 % 100 mL IVPB        1 g 200 mL/hr over 30 Minutes Intravenous Every 24 hours 09/29/20 1637          Micro    Objective   Vitals:   09/30/20 0945 09/30/20 1000 09/30/20 1015 09/30/20 1041  BP: 105/78 118/80 105/72 99/76  Pulse: 68 67 66 68  Resp: Temp:    97.9 F (36.6 C)  TempSrc:      SpO2: 95% 97% 99% 100%  Weight:      Height:        Intake/Output Summary (Last 24 hours) at 09/30/2020 1536 Last data filed at 09/30/2020 1356 Gross per 24 hour  Intake 240 ml  Output --  Net 240 ml   Filed Weights   09/28/20 0039  Weight: 97.5 kg    Physical Exam:  General exam: awake, alert, no acute distress Respiratory system: CTAB, no wheezes, rales or rhonchi, normal respiratory effort. Cardiovascular system: normal S1/S2, RRR, no pedal edema.   Central nervous system: A&O x4. no gross focal neurologic deficits, normal speech Extremities: Erythema of the right fifth toe appears stable to slightly improved.  Palpable dorsalis pedis pulses bilaterally.  No edema, normal tone, no cyanosis Psychiatry: normal mood, congruent affect, judgement and insight appear normal  Labs   Data Reviewed: I have personally reviewed following labs and imaging studies  CBC: Recent Labs  Lab 09/27/20 2006 09/29/20 0626 09/30/20 0641  WBC 8.6 6.8 5.9  NEUTROABS 6.1 3.1 2.6  HGB 15.3* 14.6 14.0  HCT 45.5 42.9 40.1  MCV 90.3 89.7 89.7  PLT 294 271 232   Basic Metabolic Panel: Recent Labs  Lab 09/27/20 2006 09/29/20 0626 09/30/20 0641  NA 136 137 139  K 3.2* 3.1* 3.6  CL 101 102 103  CO2  GLUCOSE 108* 88 83  BUN 5* 7 6  CREATININE 0.65 0.65 0.69  CALCIUM 8.7* 8.2* 8.2*  MG  --  2.0 2.0  PHOS  --  3.7 4.3   GFR: Estimated Creatinine Clearance: 105.7 mL/min (by C-G formula based on SCr of 0.69 mg/dL). Liver Function Tests: Recent Labs  Lab 09/27/20 2006 09/29/20 0626  AST 20 22  ALT 16 15  ALKPHOS 76 67  BILITOT 0.7 0.8  PROT 7.5 6.6  ALBUMIN 4.0 3.3*   Recent Labs  Lab 09/28/20 1658  LIPASE 20   No results for input(s): AMMONIA in the last 168 hours. Coagulation  Profile: No results for input(s): INR, PROTIME in the last 168 hours. Cardiac Enzymes: No results for input(s): CKTOTAL, CKMB, CKMBINDEX, TROPONINI in the last 168 hours. BNP (last 3 results) No results for input(s): PROBNP in the last 8760 hours. HbA1C: No results for input(s): HGBA1C in the last 72 hours. CBG: No results for input(s): GLUCAP in the last 168 hours. Lipid Profile: No results for input(s): CHOL, HDL, LDLCALC, TRIG, CHOLHDL, LDLDIRECT in the last 72 hours. Thyroid Function Tests: Recent Labs    09/28/20 1658  TSH 0.586   Anemia Panel: No results for input(s): VITAMINB12, FOLATE, FERRITIN, TIBC, IRON, RETICCTPCT in the last 72 hours. Sepsis Labs: Recent Labs  Lab 09/27/20 2006 09/28/20 [id number]  LATICACIDVEN 1.1 0.8    Recent Results (from the past 240 hour(s))  SARS CORONAVIRUS 2 (TAT 6-24 HRS) Nasopharyngeal Nasopharyngeal Swab     Status: None   Collection Time: 09/27/20  9:31 PM   Specimen: Nasopharyngeal Swab  Result Value Ref Range Status   SARS Coronavirus 2 NEGATIVE NEGATIVE Final    Comment: (NOTE) SARS-CoV-2 target nucleic acids are NOT DETECTED.  The SARS-CoV-2 RNA is generally detectable in upper and lower respiratory specimens during the acute phase of infection. Negative results do not preclude SARS-CoV-2 infection, do not rule out co-infections with other pathogens, and should not be used as the sole basis for treatment or other patient  management decisions. Negative results must be combined with clinical observations, patient history, and epidemiological information. The expected result is Negative.  Fact Sheet for Patients: HairSlick.nohttps://www.fda.gov/media/138098/download  Fact Sheet for Healthcare Providers: quierodirigir.comhttps://www.fda.gov/media/138095/download  This test is not yet approved or cleared by the [place] FDA and  has been authorized for detection and/or diagnosis of SARS-CoV-2 by FDA under an Emergency Use Authorization (EUA). This EUA will remain  in effect (meaning this test can be used) for the duration of the COVID-19 declaration under Se ction 564(b)(1) of the Act, 21 U.S.C. section 360bbb-3(b)(1), unless the authorization is terminated or revoked sooner.  Performed at [facility], [address] [place], [place] [address]   Gastrointestinal Panel by PCR , Stool     Status: Abnormal   Collection Time: 09/28/20  4:01 PM   Specimen: STOOL  Result Value Ref Range Status   Campylobacter species NOT DETECTED NOT DETECTED Final   Plesimonas shigelloides NOT DETECTED NOT DETECTED Final   Salmonella species NOT DETECTED NOT DETECTED Final   Yersinia enterocolitica NOT DETECTED NOT DETECTED Final   Vibrio species NOT DETECTED NOT DETECTED Final   Vibrio cholerae NOT DETECTED NOT DETECTED Final   Enteroaggregative E coli (EAEC) NOT DETECTED NOT DETECTED Final   Enteropathogenic E coli (EPEC) NOT DETECTED NOT DETECTED Final   Enterotoxigenic E coli (ETEC) NOT DETECTED NOT DETECTED Final   Shiga like toxin producing E coli (STEC) NOT DETECTED NOT DETECTED Final   Shigella/Enteroinvasive E coli (EIEC) NOT DETECTED NOT DETECTED Final   Cryptosporidium NOT DETECTED NOT DETECTED Final   Cyclospora cayetanensis NOT DETECTED NOT DETECTED Final   Entamoeba histolytica NOT DETECTED NOT DETECTED Final   Giardia lamblia NOT DETECTED NOT DETECTED Final   Adenovirus F40/41 NOT DETECTED NOT DETECTED Final    Astrovirus NOT DETECTED NOT DETECTED Final   Norovirus GI/GII NOT DETECTED NOT DETECTED Final   Rotavirus A DETECTED (A) NOT DETECTED Final   Sapovirus (I, II, IV, and V) NOT DETECTED NOT DETECTED Final    Comment: Performed at [facility], [address]  [place], [place] [address]  C Difficile Quick Screen w PCR reflex     Status: None   Collection Time: 09/28/20  4:01 PM   Specimen: STOOL  Result Value Ref Range Status   C Diff antigen NEGATIVE NEGATIVE Final   C Diff toxin NEGATIVE NEGATIVE Final   C Diff interpretation No C. difficile detected.  Final    Comment: Performed at [facility], [address] [place], [place] [address]      Imaging Studies   CT ABDOMEN PELVIS W CONTRAST  Result Date: 09/29/2020 CLINICAL DATA:  Watery diarrhea, abdominal pain, concern for diverticulosis. History of cholecystectomy. EXAM: CT ABDOMEN AND PELVIS WITH CONTRAST TECHNIQUE: Multidetector CT imaging of the abdomen and pelvis was performed using the standard protocol following bolus administration of intravenous contrast. CONTRAST:  OMNIPAQUE IOHEXOL 300 MG/ML  SOLN COMPARISON:  CT 06/20/2019 FINDINGS: Lower chest: Lung bases are clear. Normal heart size. No pericardial effusion. Hepatobiliary: Diffuse hepatic hypoattenuation compatible with hepatic steatosis. No worrisome focal liver lesions. Smooth liver surface contour. Prior cholecystectomy. No biliary ductal dilatation or visible intraductal gallstones. Pancreas: No pancreatic ductal dilatation or surrounding inflammatory changes. Spleen: Normal in size. No concerning splenic lesions. Stable posterior splenic calcification, almost certainly benign. Adrenals/Urinary Tract: Normal adrenal glands. Kidneys are normally located with symmetric enhancement and excretion. No suspicious renal lesion, urolithiasis or hydronephrosis. Urinary bladder is largely decompressed at the time of exam and therefore poorly evaluated by CT  imaging. Some faint perivesicular hazy stranding is present. Stomach/Bowel: Distal esophagus and stomach are unremarkable. Air-filled duodenal diverticulum, present on comparison. No associated inflammation. Duodenal sweep with normal course across the midline abdomen. No small bowel thickening or dilatation. High attenuation enteric contrast media traverses through most of the colon. No focal colonic thickening or dilatation is evident. Normal appendix seen in the right lower quadrant. Vascular/Lymphatic: Increasing conspicuity of a focal region of mid mesenteric hazy stranding with numerous reactive appearing clustered mid mesenteric lymph nodes compatible with mesenteritis (2/42). Atherosclerotic calcifications within the abdominal aorta and branch vessels. No aneurysm or ectasia. No enlarged abdominopelvic lymph nodes. Reproductive: Retroverted and slightly retroflexed uterus. No concerning adnexal lesions. Other: No abdominopelvic free fluid or free gas. No bowel containing hernias. Redemonstration of a large ovoid intermediate attenuation lesion in the left inguinal region measuring approximately 5.6 x 5.5 cm, previously 5.4 x 5.3 cm common likely not significantly changed from the comparison exam accounting for differences in technique. Musculoskeletal: Mild degenerative changes in the spine. Features most pronounced L5-S1 with a small left central disc protrusion and facet arthropathy resulting in at least mild canal and foraminal narrowing. Additional minimal degenerative changes in the hips and pelvis. No acute osseous abnormality or suspicious osseous lesion. IMPRESSION: 1. 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 No significant colonic diverticular disease. 2. Lack of formed stool may reflect a rapid transit state/diarrheal illness. 3. Increasing conspicuity of a focal region of mid mesenteric hazy stranding with numerous reactive appearing clustered mid mesenteric lymph nodes compatible with  mesenteritis. 4. Redemonstration of a ovoid intermediate attenuation lesion in the left inguinal region likely not significantly changed from the comparison exam accounting for differences in technique. Could reflect a complex or proteinaceous sebaceous cyst. If further imaging is clinically warranted, recommend utilizing ultrasound imaging. 5. Hepatic steatosis. 6. Prior cholecystectomy. 7. Aortic Atherosclerosis (ICD10-I70.0). Electronically Signed   By: Kreg Shropshire M.D.   On: 09/29/2020 00:14   PERIPHERAL VASCULAR CATHETERIZATION  Result Date: 09/30/2020 See op note  Medications   Scheduled Meds: . aspirin EC  81 mg Oral Daily  . atorvastatin  10 mg Oral Daily  . clopidogrel  75 mg Oral Daily  . enoxaparin (LOVENOX) injection  0.5 mg/kg Subcutaneous Q24H  . nicotine  14 mg Transdermal Daily   Continuous Infusions: . sodium chloride 75 mL/hr at 09/30/20 0017  . cefTRIAXone (ROCEPHIN)  IV 1 g (09/29/20 1934)       LOS: 2 days    Time spent: 25 minutes with greater than 50% spent at bedside and in coordination of care    Pennie Banter, DO [facility]  09/30/2020, 3:36 PM      If 7PM-7AM, please contact night-coverage. How to contact the [facility] Attending or Consulting provider 7A - 7P or covering provider during after hours 7P -7A, for this patient?    1. Check the care team in [facility] and look for a) attending/consulting [facility] provider listed and b) the [facility] team listed 2. Log into [email or web address] and use [facility] universal password to access. If you do not have the password, please contact the hospital operator. 3. Locate the [facility] provider you are looking for under [facility] and page to a number that you can be directly reached. 4. If you still have difficulty reaching the provider, please page the [facility] (Director on Call) for the Hospitalists listed on amion for assistance.

## 2020-09-30 NOTE — Op Note (Signed)
[facility]  Percutaneous Study/Intervention Procedural Note   Date of Surgery: 09/30/2020  Surgeon(s):Deanna Wiater    Assistants:none  Pre-operative Diagnosis: PAD with ulceration right lower extremity  Post-operative diagnosis:  Same  Procedure(s) Performed:             1.  Ultrasound guidance for vascular access left femoral artery             2.  Catheter placement into right SFA from left femoral approach             3.  Aortogram and selective right lower extremity angiogram             4.   Stent placement to the aorta with 14 mm diameter by 6 cm length life star stent postdilated with a 14 mm balloon             5.  StarClose closure device left femoral artery  EBL: 10 cc  Contrast: 80 cc  Fluoro Time: 4.1 minutes  Moderate Conscious Sedation Time: approximately 54 minutes using 3 mg of Versed and 75 mcg of Fentanyl              Indications:  Patient is a 45 y.o.female with nonhealing ulceration of the right foot. The patient is brought in for angiography for further evaluation and potential treatment.  Due to the limb threatening nature of the situation, angiogram was performed for attempted limb salvage. The patient is aware that if the procedure fails, amputation would be expected.  The patient also understands that even with successful revascularization, amputation may still be required due to the severity of the situation.  Risks and benefits are discussed and informed consent is obtained.   Procedure:  The patient was identified and appropriate procedural time out was performed.  The patient was then placed supine on the table and prepped and draped in the usual sterile fashion. Moderate conscious sedation was administered during a face to face encounter with the patient throughout the procedure with my supervision of the RN administering medicines and monitoring the patient's vital signs, pulse oximetry, telemetry and mental status throughout from  the start of the procedure until the patient was taken to the recovery room. Ultrasound was used to evaluate the left common femoral artery.  It was patent .  A digital ultrasound image was acquired.  A Seldinger needle was used to access the left common femoral artery under direct ultrasound guidance and a permanent image was performed.  A 0.035 J wire was advanced without resistance and a 5Fr sheath was placed.  Pigtail catheter was placed into the aorta and an AP aortogram was performed. This demonstrated normal renal arteries and somewhat small but relatively normal iliac arteries.  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  A lesion of this would certainly limit inflow even with pulses palpable distally.  Further evaluation of the right lower extremity is still warranted to evaluate if any intervention would be necessary there as well. I then crossed the aortic bifurcation and advanced to the right femoral head.  Flow was relatively slow likely due to her inflow lesion, so I had to advance into the right proximal to mid SFA to opacify distally.  Selective right lower extremity angiogram was then performed. This demonstrated a normal common femoral artery, profunda femoris artery, superficial femoral, and popliteal arteries without focal stenosis.  Typical tibial trifurcation with very slow  flow through the tibial vessels although the posterior tibial artery was continuous to the foot.  The anterior tibial and peroneal arteries did not have any obvious focal stenosis but flow was slow enough I could not see the centering of the foot/ankle area. It was felt that it was in the patient's best interest to proceed with intervention after these images to avoid a second procedure and a larger amount of contrast and fluoroscopy based off of the findings from the initial angiogram. The patient was systemically heparinized and a 6 French sheath and  ultimately a 7 French sheath was then placed over the [employer] wire. I then crossed the aortic lesion without difficulty again and placed a pigtail catheter for magnified roadmap images.  I then selected a 14 mm diameter by 6 cm length life star stent and deployed this from just below the renal arteries to the distal aorta above the iliac bifurcation.  This is postdilated with 10 mm balloon but there remained a significant defect and so I upsized the sheath and ballooned this with a 14 mm balloon with excellent angiographic completion result and only about a 10 to 15% residual stenosis at this point. I elected to terminate the procedure. The sheath was removed and StarClose closure device was deployed in the left femoral artery with excellent hemostatic result. The patient was taken to the recovery room in stable condition having tolerated the procedure well.  Findings:               Aortogram:  This demonstrated normal renal arteries and somewhat small but relatively normal iliac arteries.  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  A lesion of this would certainly limit inflow even with pulses palpable distally.             Right lower Extremity:  Normal common femoral artery, profunda femoris artery, superficial femoral, and popliteal arteries without focal stenosis.  Typical tibial trifurcation with very slow flow through the tibial vessels although the posterior tibial artery was continuous to the foot.  The anterior tibial and peroneal arteries did not have any obvious focal stenosis but flow was slow enough I could not see the centering of the foot/ankle area.   Disposition: Patient was taken to the recovery room in stable condition having tolerated the procedure well.  Complications: None  Festus Barren 09/30/2020 9:20 AM   This note was created with Dragon Medical transcription system. Any errors in dictation are  purely unintentional.

## 2020-10-01 ENCOUNTER — Encounter: Payer: Self-pay | Admitting: Vascular Surgery

## 2020-10-01 ENCOUNTER — Other Ambulatory Visit: Payer: Self-pay | Admitting: Internal Medicine

## 2020-10-01 LAB — BASIC METABOLIC PANEL
Anion gap: 6 (ref 5–15)
BUN: 5 mg/dL — ABNORMAL LOW (ref 6–20)
CO2: 28 mmol/L (ref 22–32)
Calcium: 8 mg/dL — ABNORMAL LOW (ref 8.9–10.3)
Chloride: 105 mmol/L (ref 98–111)
Creatinine, Ser: 0.57 mg/dL (ref 0.44–1.00)
GFR, Estimated: >60 mL/min (ref >60)
Glucose, Bld: 102 mg/dL — ABNORMAL HIGH (ref 70–99)
Potassium: 3.4 mmol/L — ABNORMAL LOW (ref 3.5–5.1)
Sodium: 139 mmol/L (ref 135–145)

## 2020-10-01 LAB — CBC WITH DIFFERENTIAL/PLATELET
Abs Immature Granulocytes: 0.02 10*3/uL (ref 0.00–0.07)
Basophils Absolute: 0 10*3/uL (ref 0.0–0.1)
Basophils Relative: 0 %
Eosinophils Absolute: 0.2 10*3/uL (ref 0.0–0.5)
Eosinophils Relative: 2 %
HCT: 38 % (ref 36.0–46.0)
Hemoglobin: 13.1 g/dL (ref 12.0–15.0)
Immature Granulocytes: 0 %
Lymphocytes Relative: 32 %
Lymphs Abs: 2.2 10*3/uL (ref 0.7–4.0)
MCH: 30.9 pg (ref 26.0–34.0)
MCHC: 34.5 g/dL (ref 30.0–36.0)
MCV: 89.6 fL (ref 80.0–100.0)
Monocytes Absolute: 0.4 10*3/uL (ref 0.1–1.0)
Monocytes Relative: 6 %
Neutro Abs: 4 10*3/uL (ref 1.7–7.7)
Neutrophils Relative %: 60 %
Platelets: 248 10*3/uL (ref 150–400)
RBC: 4.24 MIL/uL (ref 3.87–5.11)
RDW: 13.3 % (ref 11.5–15.5)
WBC: 6.8 10*3/uL (ref 4.0–10.5)
nRBC: 0 % (ref 0.0–0.2)

## 2020-10-01 LAB — LIPID PANEL
Cholesterol: 129 mg/dL (ref 0–200)
HDL: 24 mg/dL — ABNORMAL LOW (ref >40)
LDL Cholesterol: 83 mg/dL (ref 0–99)
Total CHOL/HDL Ratio: 5.4 RATIO
Triglycerides: 111 mg/dL (ref <150)
VLDL: 22 mg/dL (ref 0–40)

## 2020-10-01 LAB — MAGNESIUM: Magnesium: 1.8 mg/dL (ref 1.7–2.4)

## 2020-10-01 LAB — PHOSPHORUS: Phosphorus: 3.9 mg/dL (ref 2.5–4.6)

## 2020-10-01 MED ORDER — CLOPIDOGREL BISULFATE 75 MG PO TABS: 75.0000 mg | ORAL_TABLET | Freq: Every day | ORAL | 2 refills | Status: DC

## 2020-10-01 MED ORDER — ATORVASTATIN CALCIUM 10 MG PO TABS: 10.0000 mg | ORAL_TABLET | Freq: Every day | ORAL | 2 refills | Status: DC

## 2020-10-01 MED ORDER — ASPIRIN 81 MG PO TBEC: 81.0000 mg | DELAYED_RELEASE_TABLET | Freq: Every day | ORAL | 2 refills | Status: AC

## 2020-10-01 MED ORDER — POTASSIUM CHLORIDE CRYS ER 20 MEQ PO TBCR
40.0000 meq | EXTENDED_RELEASE_TABLET | Freq: Once | ORAL | Status: AC
  Administered 2020-10-01: 40 meq via ORAL
  Filled 2020-10-01: qty 2

## 2020-10-01 MED ORDER — NICOTINE 14 MG/24HR TD PT24: 14.0000 mg | MEDICATED_PATCH | Freq: Every day | TRANSDERMAL | 0 refills | Status: DC

## 2020-10-01 MED ORDER — HYDROCODONE-ACETAMINOPHEN 5-325 MG PO TABS: 1.0000 | ORAL_TABLET | Freq: Four times a day (QID) | ORAL | 0 refills | Status: AC | PRN

## 2020-10-01 NOTE — Discharge Summary (Signed)
Physician Discharge Summary  Grace Evans UXL:[id number] DOB: 04-Aug-1976 DOA: 09/27/2020  PCP: Lorn Junes, FNP  Admit date: 09/27/2020 Discharge date: 10/01/2020  Admitted From: home Disposition:  home  Recommendations for Outpatient Follow-up:  1. Follow up with PCP in 1-2 weeks 2. Please obtain BMP/CBC in one week 3. Please follow up with vascular surgery  Home Health: No  Equipment/Devices: None   Discharge Condition: Stable  CODE STATUS: Full  Diet recommendation: Heart Healthy     Discharge Diagnoses: Active Problems:   Ischemic ulcer of toe of right foot, with unspecified severity (HCC)   Ischemic toe   Nicotine dependence, cigarettes, uncomplicated   Ischemic toe ulcer, right, with unspecified severity (HCC)   Watery diarrhea   Abdominal pain    Summary of HPI and Hospital Course:  Summary of HPI on admission: 45 y/o female with past medical history of GERD, nicotine abuse, hx of panic attacks, HLD,  who was seen in the ER on 2/6 for cellulitis of the right fifth toe treated with Rocephin, discharged with Keflex.  Patient returned to the ED on 09/27/20 with ongoing right fifth toe pain and darkening color with warm water contact.  She reported no improvement despite taking the oral antibiotics as prescribed.   In the ED, patient mildly tachycardic, afebrile.  Labs mostly unremarkable except mild hypokalemia 3.2.  No leukocytosis, normal lactic acid.  ED provider discussed case with vascular surgeon who recommended admission to the hospital with plan for angiogram of the right lower extremity.  Hospital course to date: Treated with Rocephin for cellulitis involving R 5th toe. GI panel positive for Rotavirus.  Diarrhea improved with supportive care.  2/24: Underwent aortogram and right lower extremity angiogram, infrarenal aortic stent was placed.      Cellulitis of Right 5th toe / foot Ischemic ulceration of the right fifth toe -recently treated outpatient  with Keflex for cellulitis after being seen in the ED on 2/6, no improvement.   2/24: Underwent aortogram and right lower extremity angiogram, infrarenal aortic stent was placed by vascular surgery Treated with empiric Rocephin   Vascular disease -aortic stent placed as above. --Aspirin, Plavix, Lipitor  Right fifth toe discoloration -suspect is arterial/venous etiology.   Vascular consulted as above.  Diarrhea -improved.  GI panel was positive for rotavirus infection.  Treated with supportive care, IV fluids   Epigastric abdominal pain -CT abdomen pelvis was obtained and showed findings consistent with mesenteritis.  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  No diverticular disease of the colon.  No acute issues.  Tobacco abuse -counseled on importance of cessation especially for vascular health and optimal circulation.  Nicotine patch offered.  GERD -continue PPI  Obesity: Body mass index is 34.69 kg/m.  Complicates overall care and prognosis Primary care follow up.   Discharge Instructions   Discharge Instructions    Call MD for:  extreme fatigue   Complete by: As directed    Call MD for:  persistant dizziness or light-headedness   Complete by: As directed    Call MD for:  persistant nausea and vomiting   Complete by: As directed    Call MD for:  redness, tenderness, or signs of infection (pain, swelling, redness, odor or green/yellow discharge around incision site)   Complete by: As directed    Call MD for:  severe uncontrolled pain   Complete by: As directed    Call MD for:  temperature >100.4   Complete by: As  directed    Diet - low sodium heart healthy   Complete by: As directed    Discharge instructions   Complete by: As directed    Take all medications exactly as prescribed.  If your toe continues to be painful, please follow up with your primary care doctor or a podiatrist. The pain should continue to improve with time.  Often  when we improve circulation, patients can have increased pain for awhile.   Increase activity slowly   Complete by: As directed      Allergies as of 10/01/2020   No Known Allergies     Medication List    STOP taking these medications   benzonatate 100 MG capsule Commonly known as: Tessalon Perles   phenazopyridine 200 MG tablet Commonly known as: Pyridium   sulfamethoxazole-trimethoprim 800-160 MG tablet Commonly known as: BACTRIM DS     TAKE these medications   aspirin 81 MG EC tablet Take 1 tablet (81 mg total) by mouth daily. Swallow whole.   atorvastatin 10 MG tablet Commonly known as: LIPITOR Take 1 tablet (10 mg total) by mouth daily.   clopidogrel 75 MG tablet Commonly known as: PLAVIX Take 1 tablet (75 mg total) by mouth daily.   cyclobenzaprine 10 MG tablet Commonly known as: FLEXERIL Take 0.5 tablets (5 mg total) by mouth 3 (three) times daily as needed for muscle spasms.   dibucaine 1 % Oint Commonly known as: NUPERCAINAL Place 1 application rectally 3 (three) times daily as needed.   lidocaine 2 % solution Commonly known as: XYLOCAINE Use as directed 10 mLs in the mouth or throat every 4 (four) hours as needed (sore throat). Gargle and swallow   meloxicam 15 MG tablet Commonly known as: MOBIC Take 1 tablet (15 mg total) by mouth daily.   methocarbamol 500 MG tablet Commonly known as: ROBAXIN Take 1 tablet (500 mg total) by mouth 2 (two) times daily.   nicotine 14 mg/24hr patch Commonly known as: NICODERM CQ - dosed in mg/24 hours Place 1 patch (14 mg total) onto the skin daily.   ondansetron 4 MG tablet Commonly known as: ZOFRAN Take 4 mg by mouth every 8 (eight) hours as needed for nausea or vomiting.   traMADol 50 MG tablet Commonly known as: ULTRAM Take 1 tablet (50 mg total) by mouth every 6 (six) hours as needed.     ASK your doctor about these medications   HYDROcodone-acetaminophen 5-325 MG tablet Commonly known as:  NORCO/VICODIN Take 1 tablet by mouth every 6 (six) hours as needed for up to 5 days for moderate pain or severe pain. Ask about: Should I take this medication?       Follow-up Information    Dew, Marlow Baars, MD Follow up in 1 month(s).   Specialties: Vascular Surgery, Radiology, Interventional Cardiology Why: Can see either Dew or Vivia Birmingham. Will need aortoiliac duplex and ABI with visit. Contact information: [address] [place] [address] [phone]              No Known Allergies   If you experience worsening of your admission symptoms, develop shortness of breath, life threatening emergency, suicidal or homicidal thoughts you must seek medical attention immediately by calling 911 or calling your MD immediately  if symptoms less severe.    Please note   You were cared for by a hospitalist during your hospital stay. If you have any questions about your discharge medications or the care you received while you were in the hospital  after you are discharged, you can call the unit and asked to speak with the hospitalist on call if the hospitalist that took care of you is not available. Once you are discharged, your primary care physician will handle any further medical issues. Please note that NO REFILLS for any discharge medications will be authorized once you are discharged, as it is imperative that you return to your primary care physician (or establish a relationship with a primary care physician if you do not have one) for your aftercare needs so that they can reassess your need for medications and monitor your lab values.   Consultations:  Vascular surgery   Procedures/Studies: CT ABDOMEN PELVIS W CONTRAST  Result Date: 09/29/2020 CLINICAL DATA:  Watery diarrhea, abdominal pain, concern for diverticulosis. History of cholecystectomy. EXAM: CT ABDOMEN AND PELVIS WITH CONTRAST TECHNIQUE: Multidetector CT imaging of the abdomen and pelvis was performed using the standard  protocol following bolus administration of intravenous contrast. CONTRAST:  100mL OMNIPAQUE IOHEXOL 300 MG/ML  SOLN COMPARISON:  CT 06/20/2019 FINDINGS: Lower chest: Lung bases are clear. Normal heart size. No pericardial effusion. Hepatobiliary: Diffuse hepatic hypoattenuation compatible with hepatic steatosis. No worrisome focal liver lesions. Smooth liver surface contour. Prior cholecystectomy. No biliary ductal dilatation or visible intraductal gallstones. Pancreas: No pancreatic ductal dilatation or surrounding inflammatory changes. Spleen: Normal in size. No concerning splenic lesions. Stable posterior splenic calcification, almost certainly benign. Adrenals/Urinary Tract: Normal adrenal glands. Kidneys are normally located with symmetric enhancement and excretion. No suspicious renal lesion, urolithiasis or hydronephrosis. Urinary bladder is largely decompressed at the time of exam and therefore poorly evaluated by CT imaging. Some faint perivesicular hazy stranding is present. Stomach/Bowel: Distal esophagus and stomach are unremarkable. Air-filled duodenal diverticulum, present on comparison. No associated inflammation. Duodenal sweep with normal course across the midline abdomen. No small bowel thickening or dilatation. High attenuation enteric contrast media traverses through most of the colon. No focal colonic thickening or dilatation is evident. Normal appendix seen in the right lower quadrant. Vascular/Lymphatic: Increasing conspicuity of a focal region of mid mesenteric hazy stranding with numerous reactive appearing clustered mid mesenteric lymph nodes compatible with mesenteritis (2/42). Atherosclerotic calcifications within the abdominal aorta and branch vessels. No aneurysm or ectasia. No enlarged abdominopelvic lymph nodes. Reproductive: Retroverted and slightly retroflexed uterus. No concerning adnexal lesions. Other: No abdominopelvic free fluid or free gas. No bowel containing hernias.  Redemonstration of a large ovoid intermediate attenuation lesion in the left inguinal region measuring approximately 5.6 x 5.5 cm, previously 5.4 x 5.3 cm common likely not significantly changed from the comparison exam accounting for differences in technique. Musculoskeletal: Mild degenerative changes in the spine. Features most pronounced L5-S1 with a small left central disc protrusion and facet arthropathy resulting in at least mild canal and foraminal narrowing. Additional minimal degenerative changes in the hips and pelvis. No acute osseous abnormality or suspicious osseous lesion. IMPRESSION: 1. 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 No significant colonic diverticular disease. 2. Lack of formed stool may reflect a rapid transit state/diarrheal illness. 3. Increasing conspicuity of a focal region of mid mesenteric hazy stranding with numerous reactive appearing clustered mid mesenteric lymph nodes compatible with mesenteritis. 4. Redemonstration of a ovoid intermediate attenuation lesion in the left inguinal region likely not significantly changed from the comparison exam accounting for differences in technique. Could reflect a complex or proteinaceous sebaceous cyst. If further imaging is clinically warranted, recommend utilizing ultrasound imaging. 5. Hepatic steatosis. 6. Prior cholecystectomy. 7. Aortic Atherosclerosis (ICD10-I70.0). Electronically Signed  By: Kreg Shropshire M.D.   On: 09/29/2020 00:14   PERIPHERAL VASCULAR CATHETERIZATION  Result Date: 09/30/2020 See op note      Subjective: Pt reports feeling better.  The low back pain right after procedure has improved today.  Toe still hurts quite a lot however.  No fever/chills or other acute complaints.    Discharge Exam: Vitals:   10/01/20 1152 10/01/20 1519  BP: 99/65 100/64  Pulse: 75 76  Resp: 15 16  Temp: 98 F (36.7 C) 98.6 F (37 C)  SpO2: 98% 100%   Vitals:   10/01/20 0440 10/01/20 0741 10/01/20 1152  10/01/20 1519  BP: 103/78 99/68 99/65  100/64  Pulse: 82 81 75 76  Resp: 18 17 15 16   Temp: 98.1 F (36.7 C) 97.9 F (36.6 C) 98 F (36.7 C) 98.6 F (37 C)  TempSrc:  Oral Oral   SpO2: 99% 98% 98% 100%  Weight:      Height:        General: Pt is alert, awake, not in acute distress Cardiovascular: RRR, S1/S2 +, no rubs, no gallops Respiratory: CTA bilaterally, no wheezing, no rhonchi Abdominal: Soft, NT, ND, bowel sounds + Extremities: R 5th toe erythema resolved but still tender on palpation, no edema, no cyanosis    The results of significant diagnostics from this hospitalization (including imaging, microbiology, ancillary and laboratory) are listed below for reference.     Microbiology: No results found for this or any previous visit (from the past 240 hour(s)).   Labs: BNP (last 3 results) No results for input(s): BNP in the last 8760 hours. Basic Metabolic Panel: No results for input(s): NA, K, CL, CO2, GLUCOSE, BUN, CREATININE, CALCIUM, MG, PHOS in the last 168 hours. Liver Function Tests: No results for input(s): AST, ALT, ALKPHOS, BILITOT, PROT, ALBUMIN in the last 168 hours. No results for input(s): LIPASE, AMYLASE in the last 168 hours. No results for input(s): AMMONIA in the last 168 hours. CBC: No results for input(s): WBC, NEUTROABS, HGB, HCT, MCV, PLT in the last 168 hours. Cardiac Enzymes: No results for input(s): CKTOTAL, CKMB, CKMBINDEX, TROPONINI in the last 168 hours. BNP: Invalid input(s): POCBNP CBG: No results for input(s): GLUCAP in the last 168 hours. D-Dimer No results for input(s): DDIMER in the last 72 hours. Hgb A1c No results for input(s): HGBA1C in the last 72 hours. Lipid Profile No results for input(s): CHOL, HDL, LDLCALC, TRIG, CHOLHDL, LDLDIRECT in the last 72 hours. Thyroid function studies No results for input(s): TSH, T4TOTAL, T3FREE, THYROIDAB in the last 72 hours.  Invalid input(s): FREET3 Anemia work up No results for  input(s): VITAMINB12, FOLATE, FERRITIN, TIBC, IRON, RETICCTPCT in the last 72 hours. Urinalysis    Component Value Date/Time   COLORURINE AMBER (A) 05/27/2020 1623   APPEARANCEUR CLOUDY (A) 05/27/2020 1623   APPEARANCEUR Clear 10/30/2014 2343   LABSPEC >1.046 (H) 05/27/2020 1623   LABSPEC 1.008 10/30/2014 2343   PHURINE 5.0 05/27/2020 1623   GLUCOSEU NEGATIVE 05/27/2020 1623   GLUCOSEU Negative 10/30/2014 2343   HGBUR NEGATIVE 05/27/2020 1623   BILIRUBINUR SMALL (A) 05/27/2020 1623   BILIRUBINUR Negative 10/30/2014 2343   KETONESUR NEGATIVE 05/27/2020 1623   PROTEINUR 30 (A) 05/27/2020 1623   UROBILINOGEN 1.0 01/17/2015 1634   NITRITE NEGATIVE 05/27/2020 1623   LEUKOCYTESUR NEGATIVE 05/27/2020 1623   LEUKOCYTESUR Negative 10/30/2014 2343   Sepsis Labs Invalid input(s): PROCALCITONIN,  WBC,  LACTICIDVEN Microbiology No results found for this or any previous visit (from the  past 240 hour(s)).   Time coordinating discharge: Over 30 minutes  SIGNED:   Pennie Banter, DO [facility] 10/13/2020, 7:22 PM   If 7PM-7AM, please contact night-coverage [email or web address]

## 2020-10-01 NOTE — Clinical Social Work Note (Addendum)
Patient unable to afford to pay for all of her medications through [facility]. Lipitor and Plavix have been [facility] to [facility] CSW will pick up when ready. Aspirin and Norco will cost around $13 at [facility]. Made her aware that Nicotene patches are around $115 even with a coupon.  Charlynn Court, CSW [phone]  4:35 pm TOC Assistant delivered medications. CSW gave patient GoodRx coupon for Norco. Patient has orders to discharge home today. CSW signing off.  Charlynn Court, CSW [phone]

## 2020-10-01 NOTE — Progress Notes (Signed)
Pt iv removed, pt given d/c education, all questions answered. Pt has R arm nicotine patch on. Pt given 2 meds- plavix and atorvastatin. Pt refused WC, walked out when ride arrived. Pt has all belongings.

## 2020-10-01 NOTE — Discharge Instructions (Signed)
Gangrene °Gangrene is a medical condition that is caused by a lack of blood supply to an area of your body. Oxygen travels through blood, so less blood means less oxygen. Without oxygen, body tissue will start to die. °Gangrene can affect many parts of the body. It is most common on the skin (external gangrene) and on your arms and legs, but it can also affect internal body parts (internal gangrene). Gangrene requires emergency medical treatment. °What are the causes? °Any condition that cuts off blood supply can cause gangrene. Common causes include: °· Injuries. °· Blood vessel diseases. °· Diabetes. °· Infections. °What increases the risk? °You may be at increased risk for gangrene if you have recently had surgery, or if you have: °· A serious injury. °· Diabetes. °· A weak disease-fighting system (immune system). °· Narrowing of your arteries due to plaque buildup (arteriosclerosis). °· An immune system disease that causes your arteries to tighten (Raynaud's disease). °· A history of IV drug use. °What are the signs or symptoms? °Symptoms depend on which part of your body is affected. If you have external gangrene, you may have: °· Severe pain, followed by loss of feeling. °· Redness and swelling. °· Crackling sounds when you press on a swollen area of skin. °· A wound with bad-smelling drainage. °· Changes in the color of your skin. It may turn red, blue, black, or white. °· Fever and chills. °If you have internal gangrene, you may have: °· Fever and chills. °· Confusion. °· Dizziness. °· Severe pain. °· Rapid heartbeat and breathing. °· Loss of appetite. °· Nausea or vomiting.   °How is this diagnosed? °This condition may be diagnosed based on: °· Your symptoms and medical history. °· A physical exam. °· X-rays of your blood vessels after injecting dye into your blood vessels (arteriogram). °· Blood tests to check for infection. °· Imaging tests such as a CT scan or MRI. °· Testing a sample of wound drainage  for bacteria (culture). °· Testing a tissue sample for cell death (biopsy). °· Surgery to look for gangrene inside your body. °How is this treated? °Treatment for gangrene depends on the cause and the area of your body that is affected. Gangrene is usually treated in the hospital. It is very important to start treatment early, before gangrene spreads. Treatment may include: °· High doses of IV antibiotic medicine, for gangrene caused by infection. °· Surgery. Surgical options may include: °? Debridement. This is surgery to remove dead tissue. You may need to have debridement several times. °? Bypass or angioplasty. These surgeries improve blood flow to an affected area. °? Amputation. This is surgery to remove a body part. This may be necessary in very severe cases. °· Oxygen therapy. This involves treatment in a chamber designed to provide high levels of oxygen (hyperbaric oxygen therapy). It can improve the oxygen supply to an affected area. °· Supportive care to keep the rest of your body healthy. This may include: °? IV fluids and nutrients. °? Pain medicines. °? Blood thinners to prevent blood clots. °Follow these instructions at home: °Skin and wound care °· Clean any cuts or scratches with a germ-killing (antiseptic) solution. Your health care provider may recommend certain over-the-counter antiseptics. °· Cover any open wounds with a clean bandage. Change the bandage as often as needed to keep the wound clean. Make sure to wash your hands before touching your bandage. °· Check wounds every day for signs of infection, such as: °? Redness, swelling, or pain. °?   Fluid or blood. °? Warmth. °? Pus or a bad smell. °· If you have diabetes or another blood vessel disease, check your feet every day for signs of injury or infection. You may be at higher risk for gangrene. °Lifestyle °· Do not use any products that contain nicotine or tobacco, such as cigarettes and e-cigarettes. These can delay wound healing. If you  need help quitting, ask your health care provider. °· Limit alcohol intake to no more than 1 drink a day for women (no drinks if you are pregnant) and 2 drinks a day for men. One drink equals 12 oz of beer, 5 oz of wine, or 1½ oz of hard liquor. °· Eat a healthy diet and maintain a healthy weight. °· Get some exercise on most days of the week. Ask your health care provider what forms of exercise may be best for you. °General instructions °· Keep all follow-up visits as told by your health care provider. This is important. °Medicines °· Take over-the-counter and prescription medicines only as told by your health care provider. °· If you were prescribed an antibiotic medicine, take it as told by your health care provider. Do not stop taking the antibiotic even if you start to feel better. °· If you are taking blood thinners: °? Talk with your health care provider before you take any medicines that contain aspirin or NSAIDs. These medicines increase your risk for dangerous bleeding. °? Take your medicine exactly as told, at the same time every day. °? Avoid activities that could cause injury or bruising, and follow instructions about how to prevent falls. °? Wear a medical alert bracelet or carry a card that lists what medicines you take. °Contact a health care provider if: °· You have a wound or sore that is not healing. °Get help right away if: °· You have a wound or sore that shows signs of infection. °· An area of your skin turns white, red, blue, or black. °· You have rapidly worsening pain at the site of a skin infection or wound. °· You experience numbness at the site of a skin infection or wound. °· You have unexplained: °? Fever. °? Chills. °? Confusion. °? Fainting. °Summary °· Gangrene is a medical condition that is caused by a lack of blood supply to an area of your body. °· It is most common on the skin (external gangrene), but it can also affect internal body parts (internal gangrene). °· Injuries and  blood vessel diseases are common causes of gangrene. °· Gangrene requires emergency medical treatment. Treatment may include hospitalization, antibiotics, or surgery. °This information is not intended to replace advice given to you by your health care provider. Make sure you discuss any questions you have with your health care provider. °Document Revised: 05/20/2020 Document Reviewed: 05/20/2020 °Elsevier Patient Education © 2021 [employer] ° °

## 2020-11-08 ENCOUNTER — Other Ambulatory Visit: Payer: Self-pay

## 2020-11-08 MED FILL — Atorvastatin Calcium Tab 10 MG (Base Equivalent): ORAL | 30 days supply | Qty: 30 | Fill #0 | Status: AC

## 2020-11-08 MED FILL — Clopidogrel Bisulfate Tab 75 MG (Base Equiv): ORAL | 30 days supply | Qty: 30 | Fill #0 | Status: AC

## 2020-12-15 ENCOUNTER — Other Ambulatory Visit: Payer: Self-pay

## 2020-12-15 MED FILL — Clopidogrel Bisulfate Tab 75 MG (Base Equiv): ORAL | 30 days supply | Qty: 30 | Fill #1 | Status: AC

## 2020-12-15 MED FILL — Atorvastatin Calcium Tab 10 MG (Base Equivalent): ORAL | 30 days supply | Qty: 30 | Fill #1 | Status: AC

## 2020-12-24 ENCOUNTER — Ambulatory Visit: Payer: Medicaid Other | Admitting: Pharmacy Technician

## 2020-12-24 ENCOUNTER — Other Ambulatory Visit: Payer: Self-pay

## 2020-12-24 DIAGNOSIS — Z79899 Other long term (current) drug therapy: Secondary | ICD-10-CM

## 2020-12-24 NOTE — Progress Notes (Signed)
Completed [facility] application and contract.  Patient agreed to all terms of the [facility] contract.    Patient approved to receive medication assistance at [facility] until time for re-certification in 2023, and as long as eligibility criteria continues to be met.    Provided patient with community resource material based on her particular needs.    Azarius Lambson J. Gilbert Narain Care Manager [facility]  

## 2021-01-05 ENCOUNTER — Other Ambulatory Visit: Payer: Self-pay

## 2021-02-02 ENCOUNTER — Other Ambulatory Visit: Payer: Self-pay | Admitting: Internal Medicine

## 2021-02-03 ENCOUNTER — Other Ambulatory Visit: Payer: Self-pay

## 2021-02-04 ENCOUNTER — Other Ambulatory Visit: Payer: Self-pay

## 2021-02-08 ENCOUNTER — Other Ambulatory Visit: Payer: Self-pay

## 2021-02-09 ENCOUNTER — Other Ambulatory Visit: Payer: Self-pay

## 2021-07-18 ENCOUNTER — Encounter (HOSPITAL_COMMUNITY): Payer: Self-pay | Admitting: *Deleted

## 2021-07-18 ENCOUNTER — Emergency Department (HOSPITAL_COMMUNITY)
Admission: EM | Admit: 2021-07-18 | Discharge: 2021-07-18 | Disposition: A | Discharge status: Home / Self Care | Payer: Medicaid Other | Attending: Emergency Medicine | Admitting: Emergency Medicine

## 2021-07-18 DIAGNOSIS — H9201 Otalgia, right ear: Secondary | ICD-10-CM | POA: Insufficient documentation

## 2021-07-18 DIAGNOSIS — R591 Generalized enlarged lymph nodes: Secondary | ICD-10-CM | POA: Insufficient documentation

## 2021-07-18 DIAGNOSIS — F17210 Nicotine dependence, cigarettes, uncomplicated: Secondary | ICD-10-CM | POA: Insufficient documentation

## 2021-07-18 DIAGNOSIS — Z7982 Long term (current) use of aspirin: Secondary | ICD-10-CM | POA: Insufficient documentation

## 2021-07-18 MED ORDER — AMOXICILLIN 500 MG PO CAPS: 500.0000 mg | ORAL_CAPSULE | Freq: Three times a day (TID) | ORAL | 0 refills | Status: DC

## 2021-07-18 NOTE — ED Triage Notes (Signed)
Right ear pain onset yesterday

## 2021-07-18 NOTE — ED Provider Notes (Signed)
[facility] EMERGENCY DEPARTMENT Provider Note   CSN: [id number] Arrival date & time: 07/18/21  1450     History Chief Complaint  Patient presents with   Otalgia    Grace Evans is a 45 y.o. female.  The history is provided by the patient. No language interpreter was used.  Otalgia Location:  Right Behind ear:  No abnormality Quality:  Aching Severity:  Mild Onset quality:  Gradual Timing:  Constant Pt complains of swollen glands right side of neck and ear pain     Past Medical History:  Diagnosis Date   GERD (gastroesophageal reflux disease)    Hyperlipidemia     Patient Active Problem List   Diagnosis Date Noted   Watery diarrhea 09/28/2020   Abdominal pain 09/28/2020   Ischemic ulcer of toe of right foot, with unspecified severity (HCC) 09/27/2020   Ischemic toe 09/27/2020   Nicotine dependence, cigarettes, uncomplicated 09/27/2020   Ischemic toe ulcer, right, with unspecified severity (HCC) 09/27/2020   Obesity, unspecified 05/08/2012   History of panic attacks 07/24/2006    Past Surgical History:  Procedure Laterality Date   CHOLECYSTECTOMY     LOWER EXTREMITY ANGIOGRAPHY Right 09/30/2020   Procedure: Lower Extremity Angiography;  Surgeon: Annice Needy, MD;  Location: [facility] INVASIVE CV LAB;  Service: Cardiovascular;  Laterality: Right;     OB History     Gravida  2   Para      Term      Preterm      AB  1   Living  1      SAB      IAB      Ectopic      Multiple      Live Births              Family History  Problem Relation Age of Onset   Thyroid cancer Mother    Heart attack Mother    Congestive Heart Failure Father    Heart attack Father    Hyperlipidemia Father    Seizures Son    Diabetes Maternal Grandmother    Stroke Maternal Grandmother    Stroke Maternal Grandfather     Social History   Tobacco Use   Smoking status: Every Day    Packs/day: 1.00    Types: Cigarettes   Smokeless tobacco: Never  Vaping Use    Vaping Use: Never used  Substance Use Topics   Alcohol use: No   Drug use: No    Home Medications Prior to Admission medications   Medication Sig Start Date End Date Taking? Authorizing Provider  amoxicillin (AMOXIL) 500 MG capsule Take 1 capsule (500 mg total) by mouth 3 (three) times daily. 07/18/21  Yes Elson Areas, PA-C  aspirin EC 81 MG EC tablet Take 1 tablet (81 mg total) by mouth daily. Swallow whole. 10/02/20   Esaw Grandchild A, DO  atorvastatin (LIPITOR) 10 MG tablet TAKE ONE TABLET BY MOUTH EVERY DAY 10/01/20 05/06/21  Esaw Grandchild A, DO  clopidogrel (PLAVIX) 75 MG tablet TAKE ONE TABLET BY MOUTH EVERY DAY 10/01/20 10/01/21  Esaw Grandchild A, DO  cyclobenzaprine (FLEXERIL) 10 MG tablet Take 0.5 tablets (5 mg total) by mouth 3 (three) times daily as needed for muscle spasms. 05/27/20   Triplett, Cari B, FNP  dibucaine (NUPERCAINAL) 1 % OINT Place 1 application rectally 3 (three) times daily as needed. 10/24/19   Menshew, Charlesetta Ivory, PA-C  lidocaine (XYLOCAINE) 2 % solution Use as  directed 10 mLs in the mouth or throat every 4 (four) hours as needed (sore throat). Gargle and swallow 04/05/20   Cuthriell, Delorise Royals, PA-C  meloxicam (MOBIC) 15 MG tablet Take 1 tablet (15 mg total) by mouth daily. 05/27/20   Triplett, Rulon Eisenmenger B, FNP  methocarbamol (ROBAXIN) 500 MG tablet Take 1 tablet (500 mg total) by mouth 2 (two) times daily. 01/07/20   Maxwell Caul, PA-C  nicotine (NICODERM CQ - DOSED IN MG/24 HOURS) 14 mg/24hr patch Place 1 patch (14 mg total) onto the skin daily. 10/02/20   Esaw Grandchild A, DO  ondansetron (ZOFRAN) 4 MG tablet Take 4 mg by mouth every 8 (eight) hours as needed for nausea or vomiting.    [provider]  traMADol (ULTRAM) 50 MG tablet Take 1 tablet (50 mg total) by mouth every 6 (six) hours as needed. 09/12/20   Sherrie Mustache Roselyn Bering, PA-C  albuterol (PROVENTIL HFA;VENTOLIN HFA) 108 (90 Base) MCG/ACT inhaler Inhale 2 puffs into the lungs every 4 (four)  hours as needed for wheezing or shortness of breath. Patient not taking: Reported on 05/16/2019 06/11/18 10/24/19  Cuthriell, Delorise Royals, PA-C  fluticasone (FLONASE) 50 MCG/ACT nasal spray Place 1 spray into both nostrils 2 (two) times daily. Patient not taking: Reported on 05/16/2019 09/15/17 10/24/19  Cuthriell, Delorise Royals, PA-C    Allergies    Patient has no known allergies.  Review of Systems   Review of Systems  HENT:  Positive for ear pain.   All other systems reviewed and are negative.  Physical Exam Updated Vital Signs BP 121/82 (BP Location: Right Arm)   Pulse 92   Temp 98.4 F (36.9 C)   Resp 20   SpO2 99%   Physical Exam Vitals reviewed.  HENT:     Left Ear: Tympanic membrane normal.     Ears:     Comments: Right ear slight erythema      Nose: Nose normal.     Mouth/Throat:     Mouth: Mucous membranes are moist.  Eyes:     Pupils: Pupils are equal, round, and reactive to light.  Cardiovascular:     Rate and Rhythm: Normal rate.  Pulmonary:     Effort: Pulmonary effort is normal.  Musculoskeletal:        General: Normal range of motion.     Cervical back: Tenderness present.  Skin:    General: Skin is warm.  Neurological:     General: No focal deficit present.     Mental Status: She is alert.  Psychiatric:        Mood and Affect: Mood normal.    ED Results / Procedures / Treatments   Labs (all labs ordered are listed, but only abnormal results are displayed) Labs Reviewed - No data to display  EKG None  Radiology No results found.  Procedures Procedures   Medications Ordered in ED Medications - No data to display  ED Course  I have reviewed the triage vital signs and the nursing notes.  Pertinent labs & imaging results that were available during my care of the patient were reviewed by me and considered in my medical decision making (see chart for details).    MDM Rules/Calculators/A&P                           (Pt insist on antibiotics,  Pt reports episodes of the same and states she will just have to come back  if she does not get)  Final Clinical Impression(s) / ED Diagnoses Final diagnoses:  Lymphadenopathy    Rx / DC Orders ED Discharge Orders          Ordered    amoxicillin (AMOXIL) 500 MG capsule  3 times daily        07/18/21 1613          An After Visit Summary was printed and given to the patient.    Elson Areas, [place] 07/18/21 1617    Terrilee Files, MD 07/19/21 [phone]

## 2021-12-09 ENCOUNTER — Other Ambulatory Visit: Payer: Self-pay

## 2022-06-05 ENCOUNTER — Encounter (INDEPENDENT_AMBULATORY_CARE_PROVIDER_SITE_OTHER): Payer: Self-pay

## 2022-07-07 DIAGNOSIS — Z419 Encounter for procedure for purposes other than remedying health state, unspecified: Secondary | ICD-10-CM | POA: Diagnosis not present

## 2022-08-07 DIAGNOSIS — Z419 Encounter for procedure for purposes other than remedying health state, unspecified: Secondary | ICD-10-CM | POA: Diagnosis not present

## 2022-08-14 ENCOUNTER — Ambulatory Visit
Admission: EM | Admit: 2022-08-14 | Discharge: 2022-08-14 | Disposition: A | Discharge status: Home / Self Care | Payer: Medicaid Other | Attending: Family Medicine | Admitting: Family Medicine

## 2022-08-14 DIAGNOSIS — R058 Other specified cough: Secondary | ICD-10-CM | POA: Insufficient documentation

## 2022-08-14 DIAGNOSIS — R062 Wheezing: Secondary | ICD-10-CM

## 2022-08-14 DIAGNOSIS — Z7951 Long term (current) use of inhaled steroids: Secondary | ICD-10-CM | POA: Diagnosis not present

## 2022-08-14 DIAGNOSIS — E785 Hyperlipidemia, unspecified: Secondary | ICD-10-CM | POA: Diagnosis not present

## 2022-08-14 DIAGNOSIS — F17210 Nicotine dependence, cigarettes, uncomplicated: Secondary | ICD-10-CM | POA: Insufficient documentation

## 2022-08-14 DIAGNOSIS — Z1152 Encounter for screening for COVID-19: Secondary | ICD-10-CM | POA: Diagnosis not present

## 2022-08-14 DIAGNOSIS — B372 Candidiasis of skin and nail: Secondary | ICD-10-CM

## 2022-08-14 DIAGNOSIS — J069 Acute upper respiratory infection, unspecified: Secondary | ICD-10-CM | POA: Diagnosis not present

## 2022-08-14 DIAGNOSIS — K219 Gastro-esophageal reflux disease without esophagitis: Secondary | ICD-10-CM | POA: Diagnosis not present

## 2022-08-14 DIAGNOSIS — R0789 Other chest pain: Secondary | ICD-10-CM

## 2022-08-14 MED ORDER — PROMETHAZINE-DM 6.25-15 MG/5ML PO SYRP: 5.0000 mL | ORAL_SOLUTION | Freq: Four times a day (QID) | ORAL | 0 refills | Status: DC | PRN

## 2022-08-14 MED ORDER — DEXAMETHASONE SODIUM PHOSPHATE 10 MG/ML IJ SOLN
10.0000 mg | Freq: Once | INTRAMUSCULAR | Status: AC
  Administered 2022-08-14: 10 mg via INTRAMUSCULAR

## 2022-08-14 MED ORDER — NYSTATIN 100000 UNIT/GM EX CREA: TOPICAL_CREAM | CUTANEOUS | 0 refills | Status: DC

## 2022-08-14 MED ORDER — IPRATROPIUM-ALBUTEROL 0.5-2.5 (3) MG/3ML IN SOLN
3.0000 mL | Freq: Once | RESPIRATORY_TRACT | Status: AC
  Administered 2022-08-14: 3 mL via RESPIRATORY_TRACT

## 2022-08-14 MED ORDER — ALBUTEROL SULFATE HFA 108 (90 BASE) MCG/ACT IN AERS: 2.0000 | INHALATION_SPRAY | RESPIRATORY_TRACT | 0 refills | Status: DC | PRN

## 2022-08-14 NOTE — ED Triage Notes (Signed)
Pt reports she feels bad, nasal congestion, fatigue, severe headache, dizzy and weak x 4 days. Took nyquil and tylenol.

## 2022-08-14 NOTE — ED Provider Notes (Signed)
[facility]    CSN: [id number] Arrival date & time: 08/14/22  1637      History   Chief Complaint Chief Complaint  Patient presents with   Cough    HPI Grace Evans is a 47 y.o. female.   Patient presenting today with 4-day history of nasal congestion, fatigue, chills, dizziness, weakness, severe headache, cough, wheezing, chest tightness.  Denies chest pain, shortness of breath, abdominal pain, nausea vomiting or diarrhea.  So far taking NyQuil and Tylenol with no relief.  States she is a chronic cigarette smoker, no known diagnosed chronic pulmonary disease and no inhalers at home.  Will sick contacts recently but unsure with what.  She is also having a burning, moist rash to the area under her pannus.  So for not trying anything for this.    Past Medical History:  Diagnosis Date   GERD (gastroesophageal reflux disease)    Hyperlipidemia     Patient Active Problem List   Diagnosis Date Noted   Watery diarrhea 09/28/2020   Abdominal pain 09/28/2020   Ischemic ulcer of toe of right foot, with unspecified severity ([place]) 09/27/2020   Ischemic toe 09/27/2020   Nicotine dependence, cigarettes, uncomplicated 56/81/2751   Ischemic toe ulcer, right, with unspecified severity ([place]) 09/27/2020   Obesity, unspecified 05/08/2012   History of panic attacks 07/24/2006    Past Surgical History:  Procedure Laterality Date   CHOLECYSTECTOMY     LOWER EXTREMITY ANGIOGRAPHY Right 09/30/2020   Procedure: Lower Extremity Angiography;  Surgeon: Algernon Huxley, MD;  Location: [place] [facility];  Service: Cardiovascular;  Laterality: Right;    OB History     Gravida  2   Para      Term      Preterm      AB  1   Living  1      SAB      IAB      Ectopic      Multiple      Live Births               Home Medications    Prior to Admission medications   Medication Sig Start Date End Date Taking? Authorizing Provider  albuterol (VENTOLIN HFA) 108 (90  Base) MCG/ACT inhaler Inhale 2 puffs into the lungs every 4 (four) hours as needed for wheezing or shortness of breath. 08/14/22  Yes Volney American, PA-C  nystatin cream (MYCOSTATIN) Apply to affected area 2 times daily 08/14/22  Yes Volney American, PA-C  promethazine-dextromethorphan (PROMETHAZINE-DM) 6.25-15 MG/5ML syrup Take 5 mLs by mouth 4 (four) times daily as needed. 08/14/22  Yes Volney American, PA-C  amoxicillin (AMOXIL) 500 MG capsule Take 1 capsule (500 mg total) by mouth 3 (three) times daily. 07/18/21   Fransico Meadow, PA-C  aspirin EC 81 MG EC tablet Take 1 tablet (81 mg total) by mouth daily. Swallow whole. 10/02/20   Nicole Kindred A, DO  atorvastatin (LIPITOR) 10 MG tablet TAKE ONE TABLET BY MOUTH EVERY DAY 10/01/20 05/06/21  Nicole Kindred A, DO  cyclobenzaprine (FLEXERIL) 10 MG tablet Take 0.5 tablets (5 mg total) by mouth 3 (three) times daily as needed for muscle spasms. 05/27/20   Triplett, Cari B, FNP  dibucaine (NUPERCAINAL) 1 % OINT Place 1 application rectally 3 (three) times daily as needed. 10/24/19   Menshew, Dannielle Karvonen, PA-C  lidocaine (XYLOCAINE) 2 % solution Use as directed 10 mLs in the mouth or throat  every 4 (four) hours as needed (sore throat). Gargle and swallow 04/05/20   Cuthriell, Delorise Royals, PA-C  meloxicam (MOBIC) 15 MG tablet Take 1 tablet (15 mg total) by mouth daily. 05/27/20   Triplett, Rulon Eisenmenger B, FNP  methocarbamol (ROBAXIN) 500 MG tablet Take 1 tablet (500 mg total) by mouth 2 (two) times daily. 01/07/20   Maxwell Caul, PA-C  nicotine (NICODERM CQ - DOSED IN MG/24 HOURS) 14 mg/24hr patch Place 1 patch (14 mg total) onto the skin daily. 10/02/20   Esaw Grandchild A, DO  ondansetron (ZOFRAN) 4 MG tablet Take 4 mg by mouth every 8 (eight) hours as needed for nausea or vomiting.    [provider]  traMADol (ULTRAM) 50 MG tablet Take 1 tablet (50 mg total) by mouth every 6 (six) hours as needed. 09/12/20   Sherrie Mustache Roselyn Bering, PA-C   fluticasone (FLONASE) 50 MCG/ACT nasal spray Place 1 spray into both nostrils 2 (two) times daily. Patient not taking: Reported on 05/16/2019 09/15/17 10/24/19  Cuthriell, Delorise Royals, PA-C    Family History Family History  Problem Relation Age of Onset   Thyroid cancer Mother    Heart attack Mother    Congestive Heart Failure Father    Heart attack Father    Hyperlipidemia Father    Seizures Son    Diabetes Maternal Grandmother    Stroke Maternal Grandmother    Stroke Maternal Grandfather     Social History Social History   Tobacco Use   Smoking status: Every Day    Packs/day: 1.00    Types: Cigarettes   Smokeless tobacco: Never  Vaping Use   Vaping Use: Never used  Substance Use Topics   Alcohol use: No   Drug use: No     Allergies   Patient has no known allergies.   Review of Systems Review of Systems Per HPI  Physical Exam Triage Vital Signs ED Triage Vitals  Enc Vitals Group     BP 08/14/22 1743 117/78     Pulse Rate 08/14/22 1743 76     Resp 08/14/22 1743 20     Temp 08/14/22 1743 (!) 97.3 F (36.3 C)     Temp Source 08/14/22 1743 Oral     SpO2 08/14/22 1743 95 %     Weight --      Height --      Head Circumference --      Peak Flow --      Pain Score 08/14/22 1744 0     Pain Loc --      Pain Edu? --      Excl. in GC? --    No data found.  Updated Vital Signs BP 117/78 (BP Location: Right Arm)   Pulse 76   Temp (!) 97.3 F (36.3 C) (Oral)   Resp 20   LMP 08/11/2022   SpO2 95%   Visual Acuity Right Eye Distance:   Left Eye Distance:   Bilateral Distance:    Right Eye Near:   Left Eye Near:    Bilateral Near:     Physical Exam Vitals and nursing note reviewed.  Constitutional:      Appearance: Normal appearance.  HENT:     Head: Atraumatic.     Right Ear: Tympanic membrane and external ear normal.     Left Ear: Tympanic membrane and external ear normal.     Nose: Congestion present.     Mouth/Throat:     Mouth: Mucous  membranes are  moist.     Pharynx: Posterior oropharyngeal erythema present.  Eyes:     Extraocular Movements: Extraocular movements intact.     Conjunctiva/sclera: Conjunctivae normal.  Cardiovascular:     Rate and Rhythm: Normal rate and regular rhythm.     Heart sounds: Normal heart sounds.  Pulmonary:     Effort: Pulmonary effort is normal.     Breath sounds: Wheezing present. No rales.  Musculoskeletal:        General: Normal range of motion.     Cervical back: Normal range of motion and neck supple.  Skin:    General: Skin is warm and dry.  Neurological:     Mental Status: She is alert and oriented to person, place, and time.     Motor: No weakness.     Gait: Gait normal.  Psychiatric:        Mood and Affect: Mood normal.        Thought Content: Thought content normal.     UC Treatments / Results  Labs (all labs ordered are listed, but only abnormal results are displayed) Labs Reviewed  SARS CORONAVIRUS 2 (TAT 6-24 HRS)    EKG   Radiology No results found.  Procedures Procedures (including critical care time)  Medications Ordered in UC Medications  ipratropium-albuterol (DUONEB) 0.5-2.5 (3) MG/3ML nebulizer solution 3 mL (3 mLs Nebulization Given 08/14/22 1810)  dexamethasone (DECADRON) injection 10 mg (10 mg Intramuscular Given 08/14/22 1829)    Initial Impression / Assessment and Plan / UC Course  I have reviewed the triage vital signs and the nursing notes.  Pertinent labs & imaging results that were available during my care of the patient were reviewed by me and considered in my medical decision making (see chart for details).     Suspect viral upper respiratory infection leading to an exacerbation possibly of COPD.  Moderate benefit after DuoNeb treatment in clinic, will also treat with IM Decadron, albuterol, Phenergan DM.  COVID testing pending.  Regarding rash, treat with nystatin, unscented powders and good hygiene.  Return for worsening  symptoms.  Final Clinical Impressions(s) / UC Diagnoses   Final diagnoses:  Viral URI with cough  Wheezing  Chest tightness  Yeast dermatitis   Discharge Instructions   None    ED Prescriptions     Medication Sig Dispense Auth. Provider   albuterol (VENTOLIN HFA) 108 (90 Base) MCG/ACT inhaler Inhale 2 puffs into the lungs every 4 (four) hours as needed for wheezing or shortness of breath. 18 g Roosvelt Maser T[place], [place]   promethazine-dextromethorphan (PROMETHAZINE-DM) 6.25-15 MG/5ML syrup Take 5 mLs by mouth 4 (four) times daily as needed. 100 mL Particia Nearing, PA-C   nystatin cream (MYCOSTATIN) Apply to affected area 2 times daily 80 g Particia Nearing, [place]      PDMP not reviewed this encounter.   Particia Nearing, [place] 08/14/22 1845

## 2022-08-15 LAB — SARS CORONAVIRUS 2 (TAT 6-24 HRS): SARS Coronavirus 2: NEGATIVE

## 2022-09-07 DIAGNOSIS — Z419 Encounter for procedure for purposes other than remedying health state, unspecified: Secondary | ICD-10-CM | POA: Diagnosis not present

## 2022-10-06 DIAGNOSIS — Z419 Encounter for procedure for purposes other than remedying health state, unspecified: Secondary | ICD-10-CM | POA: Diagnosis not present

## 2022-10-24 ENCOUNTER — Telehealth: Payer: Self-pay

## 2022-10-24 NOTE — Telephone Encounter (Signed)
Mychart msg sent

## 2022-11-06 DIAGNOSIS — Z419 Encounter for procedure for purposes other than remedying health state, unspecified: Secondary | ICD-10-CM | POA: Diagnosis not present

## 2022-12-06 DIAGNOSIS — Z419 Encounter for procedure for purposes other than remedying health state, unspecified: Secondary | ICD-10-CM | POA: Diagnosis not present

## 2023-01-06 DIAGNOSIS — Z419 Encounter for procedure for purposes other than remedying health state, unspecified: Secondary | ICD-10-CM | POA: Diagnosis not present

## 2023-02-05 DIAGNOSIS — Z419 Encounter for procedure for purposes other than remedying health state, unspecified: Secondary | ICD-10-CM | POA: Diagnosis not present

## 2023-02-15 ENCOUNTER — Ambulatory Visit: Payer: 59 | Attending: Cardiovascular Disease | Admitting: Cardiovascular Disease

## 2023-02-15 ENCOUNTER — Other Ambulatory Visit
Admission: RE | Admit: 2023-02-15 | Discharge: 2023-02-15 | Disposition: A | Discharge status: Home / Self Care | Payer: 59 | Source: Ambulatory Visit | Attending: Cardiovascular Disease | Admitting: Cardiovascular Disease

## 2023-02-15 ENCOUNTER — Encounter: Payer: Self-pay | Admitting: Cardiovascular Disease

## 2023-02-15 VITALS — BP 108/64 | HR 77 | Ht 66.0 in | Wt 198.1 lb

## 2023-02-15 DIAGNOSIS — I739 Peripheral vascular disease, unspecified: Secondary | ICD-10-CM | POA: Insufficient documentation

## 2023-02-15 DIAGNOSIS — E785 Hyperlipidemia, unspecified: Secondary | ICD-10-CM

## 2023-02-15 DIAGNOSIS — Z720 Tobacco use: Secondary | ICD-10-CM

## 2023-02-15 DIAGNOSIS — I998 Other disorder of circulatory system: Secondary | ICD-10-CM | POA: Diagnosis not present

## 2023-02-15 LAB — COMPREHENSIVE METABOLIC PANEL
ALT: 10 U/L (ref 0–44)
AST: 13 U/L — ABNORMAL LOW (ref 15–41)
Albumin: 3.6 g/dL (ref 3.5–5.0)
Alkaline Phosphatase: 72 U/L (ref 38–126)
Anion gap: 7 (ref 5–15)
BUN: 7 mg/dL (ref 6–20)
CO2: 24 mmol/L (ref 22–32)
Calcium: 8.3 mg/dL — ABNORMAL LOW (ref 8.9–10.3)
Chloride: 104 mmol/L (ref 98–111)
Creatinine, Ser: 0.56 mg/dL (ref 0.44–1.00)
GFR, Estimated: >60 mL/min (ref >60)
Glucose, Bld: 100 mg/dL — ABNORMAL HIGH (ref 70–99)
Potassium: 3.6 mmol/L (ref 3.5–5.1)
Sodium: 135 mmol/L (ref 135–145)
Total Bilirubin: 1.1 mg/dL (ref 0.3–1.2)
Total Protein: 6.6 g/dL (ref 6.5–8.1)

## 2023-02-15 LAB — CBC
HCT: 43.2 % (ref 36.0–46.0)
Hemoglobin: 14.6 g/dL (ref 12.0–15.0)
MCH: 29.8 pg (ref 26.0–34.0)
MCHC: 33.8 g/dL (ref 30.0–36.0)
MCV: 88.2 fL (ref 80.0–100.0)
Platelets: 241 10*3/uL (ref 150–400)
RBC: 4.9 MIL/uL (ref 3.87–5.11)
RDW: 13.2 % (ref 11.5–15.5)
WBC: 12 10*3/uL — ABNORMAL HIGH (ref 4.0–10.5)
nRBC: 0 % (ref 0.0–0.2)

## 2023-02-15 LAB — LIPID PANEL
Cholesterol: 207 mg/dL — ABNORMAL HIGH (ref 0–200)
HDL: 33 mg/dL — ABNORMAL LOW (ref >40)
LDL Cholesterol: 147 mg/dL — ABNORMAL HIGH (ref 0–99)
Total CHOL/HDL Ratio: 6.3 RATIO
Triglycerides: 133 mg/dL (ref <150)
VLDL: 27 mg/dL (ref 0–40)

## 2023-02-15 MED ORDER — ATORVASTATIN CALCIUM 40 MG PO TABS: 40.0000 mg | ORAL_TABLET | Freq: Every day | ORAL | 1 refills | Status: DC

## 2023-02-15 NOTE — Progress Notes (Signed)
Cardiology Office Note   Date:  02/15/2023   ID:  Grace Evans, DOB [birth year], MRN [id number]  PCP:  Grace Sato, MD  Cardiologist:   Grace Bears, MD   Chief Complaint  Patient presents with   New Patient (Initial Visit)    Grace Evans toe discoloration c/o chest pain and vomiting believes to be acid reflux. Meds reviewed verbally with pt.      History of Present Illness: Grace Evans is a 47 y.o. female who presents to establish cardiovascular care.  Both her parents were my patients before they expired.  They had extensive cardiovascular disease. She has known history of tobacco use, GERD and hyperlipidemia. She was hospitalized in February 2022 with gangrenous right fifth toe.  She underwent an angiogram by Dr. Wyn Quaker which showed evidence of significant stenosis in the infrarenal abdominal aorta at least 60%.  No significant infrainguinal disease was noted but the blood flow was very sluggish to the right foot.  The infrarenal aortic stenosis was treated with a 14 x 6 cm life star self-expanding stent which was postdilated with a 14 mm balloon  She has not had any office follow-up due to lack of health insurance but she recently got Medicaid.  She complains of severe bilateral toe pain at rest and with walking with occasional calf claudication.  Symptoms are slightly worse on the left than the right.  She also has intermittent dark discoloration of her small toes.  She smokes 1 pack/day and has been smoking since she was 47 years old.  She had 1 episode of brief chest pain lasting few minutes at rest but she has no exertional symptoms.   Past Medical History:  Diagnosis Date   GERD (gastroesophageal reflux disease)    Hyperlipidemia     Past Surgical History:  Procedure Laterality Date   CHOLECYSTECTOMY     LOWER EXTREMITY ANGIOGRAPHY Right 09/30/2020   Procedure: Lower Extremity Angiography;  Surgeon: Annice Needy, MD;  Location: [facility] INVASIVE [facility];  Service:  Cardiovascular;  Laterality: Right;     Current Outpatient Medications  Medication Sig Dispense Refill   albuterol (VENTOLIN HFA) 108 (90 Base) MCG/ACT inhaler Inhale 2 puffs into the lungs every 4 (four) hours as needed for wheezing or shortness of breath. 18 g 0   amoxicillin (AMOXIL) 500 MG capsule Take 1 capsule (500 mg total) by mouth 3 (three) times daily. 30 capsule 0   aspirin EC 81 MG EC tablet Take 1 tablet (81 mg total) by mouth daily. Swallow whole. (Patient not taking: Reported on 02/15/2023) 30 tablet 2   atorvastatin (LIPITOR) 10 MG tablet TAKE ONE TABLET BY MOUTH EVERY DAY (Patient not taking: Reported on 02/15/2023) 30 tablet 2   cyclobenzaprine (FLEXERIL) 10 MG tablet Take 0.5 tablets (5 mg total) by mouth 3 (three) times daily as needed for muscle spasms. (Patient not taking: Reported on 02/15/2023) 30 tablet 0   dibucaine (NUPERCAINAL) 1 % OINT Place 1 application rectally 3 (three) times daily as needed. (Patient not taking: Reported on 02/15/2023) 30 g 0   lidocaine (XYLOCAINE) 2 % solution Use as directed 10 mLs in the mouth or throat every 4 (four) hours as needed (sore throat). Gargle and swallow (Patient not taking: Reported on 02/15/2023) 200 mL 0   meloxicam (MOBIC) 15 MG tablet Take 1 tablet (15 mg total) by mouth daily. (Patient not taking: Reported on 02/15/2023) 30 tablet 0   methocarbamol (ROBAXIN) 500 MG tablet Take 1  tablet (500 mg total) by mouth 2 (two) times daily. (Patient not taking: Reported on 02/15/2023) 20 tablet 0   nicotine (NICODERM CQ - DOSED IN MG/24 HOURS) 14 mg/24hr patch Place 1 patch (14 mg total) onto the skin daily. (Patient not taking: Reported on 02/15/2023) 28 patch 0   nystatin cream (MYCOSTATIN) Apply to affected area 2 times daily (Patient not taking: Reported on 02/15/2023) 80 g 0   ondansetron (ZOFRAN) 4 MG tablet Take 4 mg by mouth every 8 (eight) hours as needed for nausea or vomiting. (Patient not taking: Reported on 02/15/2023)      promethazine-dextromethorphan (PROMETHAZINE-DM) 6.25-15 MG/5ML syrup Take 5 mLs by mouth 4 (four) times daily as needed. (Patient not taking: Reported on 02/15/2023) 100 mL 0   traMADol (ULTRAM) 50 MG tablet Take 1 tablet (50 mg total) by mouth every 6 (six) hours as needed. (Patient not taking: Reported on 02/15/2023) 15 tablet 0   No current facility-administered medications for this visit.    Allergies:   Patient has no known allergies.    Social History:  The patient  reports that she has been smoking cigarettes. She started smoking about 31 years ago. She has a 31 pack-year smoking history. She has never used smokeless tobacco. She reports that she does not drink alcohol and does not use drugs.   Family History:  The patient's family history includes Congestive Heart Failure in her father; Diabetes in her maternal grandmother; Heart attack in her father and mother; Heart disease in her father and mother; Heart failure in her father and mother; Hyperlipidemia in her father; Seizures in her son; Stroke in her maternal grandfather and maternal grandmother; Thyroid cancer in her mother.    ROS:  Please see the history of present illness.   Otherwise, review of systems are positive for none.   All other systems are reviewed and negative.    PHYSICAL EXAM: VS:  BP 108/64 (BP Location: Right Arm, Patient Position: Sitting, Cuff Size: Normal)   Pulse 77   Ht 5\' 6"  (1.676 m)   Wt 198 lb 2 oz (89.9 kg)   SpO2 98%   BMI 31.98 kg/m  , BMI Body mass index is 31.98 kg/m. GEN: Well nourished, well developed, in no acute distress  HEENT: normal  Neck: no JVD, carotid bruits, or masses Cardiac: RRR; no murmurs, rubs, or gallops,no edema  Respiratory:  clear to auscultation bilaterally, normal work of breathing GI: soft, nontender, nondistended, + BS MS: no deformity or atrophy  Skin: warm and dry, no rash Neuro:  Strength and sensation are intact Psych: euthymic mood, full affect Vascular:  Femoral pulse is +1 bilaterally.  Dorsalis pedis is +1 bilaterally.  Posterior tibial is not palpable.   EKG:  EKG is ordered today. The ekg ordered today demonstrates : Normal sinus rhythm Low voltage QRS Inferior infarct (cited on or before 17-Apr-2012) Cannot rule out Anterior infarct (cited on or before 27-Jul-2004) When compared with ECG of 05-Apr-2020 17:18, QRS axis Shifted right Nonspecific T wave abnormality no longer evident in Lateral leads      Recent Labs: No results found for requested labs within last 365 days.    Lipid Panel    Component Value Date/Time   CHOL 129 10/01/2020 0553   TRIG 111 10/01/2020 0553   HDL 24 (L) 10/01/2020 0553   CHOLHDL 5.4 10/01/2020 0553   VLDL 22 10/01/2020 0553   LDLCALC 83 10/01/2020 0553      Wt Readings from Last  3 Encounters:  02/15/23 198 lb 2 oz (89.9 kg)  09/28/20 214 lb 15.2 oz (97.5 kg)  09/12/20 215 lb (97.5 kg)         02/15/2023    2:08 PM  PAD Screen  Previous PAD dx? No  Previous surgical procedure? Yes  Pain with walking? Yes  Subsides with rest? Yes  Feet/toe relief with dangling? Yes  Painful, non-healing ulcers? No  Extremities discolored? Yes      ASSESSMENT AND PLAN:  1.  Peripheral arterial disease: History of critical limb ischemia affecting the right lower extremity status post self-expanding stent placement to the infrarenal abdominal aorta.  She is now having increased toe pain and some dark discoloration but no ulceration or gangrene.  She does have palpable distal pulses but definitely abnormal.  I requested an ABI and aortoiliac duplex. I started her aspirin 81 mg once daily. Will check routine labs on her today.  2.  Tobacco use: I discussed with her the importance of smoking cessation.  3.  Hyperlipidemia: Due to PAD, I started atorvastatin 40 mg once daily.  Check lipid and liver profile today.    Disposition:   FU with me in 3 months  Signed,  Grace Bears, MD  02/15/2023  2:31 PM    [facility]

## 2023-02-15 NOTE — Patient Instructions (Signed)
Medication Instructions:  START Aspirin 81 mg once daily START Atorvastatin 40 mg once daily  *If you need a refill on your cardiac medications before your next appointment, please call your pharmacy*   Lab Work: Your provider would like for you to have following labs drawn: CBC, CMET and Lipid.   Please go to the [facility] entrance and check in at the front desk.  You do not need an appointment.  They are open from 7am-6 pm.   If you have labs (blood work) drawn today and your tests are completely normal, you will receive your results only by: MyChart Message (if you have MyChart) OR A paper copy in the mail If you have any lab test that is abnormal or we need to change your treatment, we will call you to review the results.   Testing/Procedures: Your physician has recommended that you have an AORTA/ILIAC Duplex. This is a noninvasive diagnostic test that uses ultrasound technology to look at the aorta and iliac arteries to detect any restriction to blood flow to the buttocks, groin, legs or feet.  No food after 11PM the night before.  Water is OK. (Don't drink liquids if you have been instructed not to for ANOTHER test). Avoid foods that produce bowel gas, for 24 hours prior to exam (see below). No breakfast, no chewing gum, no smoking or carbonated beverages. Patient may take morning medications with water. Come in for test at least 15 minutes early to register. This will take approximately 60 minutes This will take place at [address], [place] [address]  Your physician has requested that you have an ankle brachial index (ABI). During this test an ultrasound and blood pressure cuff are used to evaluate the arteries that supply the arms and legs with blood.  Allow thirty minutes for this exam.  There are no restrictions or special instructions.  This will take place at [address], [place]  [address]    Follow-Up: At [facility], you and your health needs are our priority.  As part of our continuing mission to provide you with exceptional heart care, we have created designated Provider Care Teams.  These Care Teams include your primary Cardiologist (physician) and Advanced Practice Providers (APPs -  Physician Assistants and Nurse Practitioners) who all work together to provide you with the care you need, when you need it.  We recommend signing up for the patient portal called "MyChart".  Sign up information is provided on this After Visit Summary.  MyChart is used to connect with patients for Virtual Visits (Telemedicine).  Patients are able to view lab/test results, encounter notes, upcoming appointments, etc.  Non-urgent messages can be sent to your provider as well.   To learn more about what you can do with MyChart, go to [email or web address].    Your next appointment:   3 month(s)  Provider:   You may see Dr. Kirke Corin or one of the following Advanced Practice Providers on your designated Care Team:   Nicolasa Ducking, NP Eula Listen, PA-C Cadence Fransico Michael, PA-C Charlsie Quest, NP    Other Instructions Managing the Challenge of Quitting Smoking Quitting smoking is a physical and mental challenge. You may have cravings, withdrawal symptoms, and temptation to smoke. Before quitting, work with your health care provider to make a plan that can help you manage quitting. Making a plan before you quit may keep you from smoking when you have the urge  to smoke while trying to quit. How to manage lifestyle changes Managing stress Stress can make you want to smoke, and wanting to smoke may cause stress. It is important to find ways to manage your stress. You could try some of the following: Practice relaxation techniques. Breathe slowly and deeply, in through your nose and out through your mouth. Listen to music. Soak in a bath or take a shower. Imagine a peaceful place or  vacation. Get some support. Talk with family or friends about your stress. Join a support group. Talk with a counselor or therapist. Get some physical activity. Go for a walk, run, or bike ride. Play a favorite sport. Practice yoga.  Medicines Talk with your health care provider about medicines that might help you deal with cravings and make quitting easier for you. Relationships Social situations can be difficult when you are quitting smoking. To manage this, you can: Avoid parties and other social situations where people might be smoking. Avoid alcohol. Leave right away if you have the urge to smoke. Explain to your family and friends that you are quitting smoking. Ask for support and let them know you might be a bit grumpy. Plan activities where smoking is not an option. General instructions Be aware that many people gain weight after they quit smoking. However, not everyone does. To keep from gaining weight, have a plan in place before you quit, and stick to the plan after you quit. Your plan should include: Eating healthy snacks. When you have a craving, it may help to: Eat popcorn, or try carrots, celery, or other cut vegetables. Chew sugar-free gum. Changing how you eat. Eat small portion sizes at meals. Eat 4-6 small meals throughout the day instead of 1-2 large meals a day. Be mindful when you eat. You should avoid watching television or doing other things that might distract you as you eat. Exercising regularly. Make time to exercise each day. If you do not have time for a long workout, do short bouts of exercise for 5-10 minutes several times a day. Do some form of strengthening exercise, such as weight lifting. Do some exercise that gets your heart beating and causes you to breathe deeply, such as walking fast, running, swimming, or biking. This is very important. Drinking plenty of water or other low-calorie or no-calorie drinks. Drink enough fluid to keep your urine pale  yellow.  How to recognize withdrawal symptoms Your body and mind may experience discomfort as you try to get used to not having nicotine in your system. These effects are called withdrawal symptoms. They may include: Feeling hungrier than normal. Having trouble concentrating. Feeling irritable or restless. Having trouble sleeping. Feeling depressed. Craving a cigarette. These symptoms may surprise you, but they are normal to have when quitting smoking. To manage withdrawal symptoms: Avoid places, people, and activities that trigger your cravings. Remember why you want to quit. Get plenty of sleep. Avoid coffee and other drinks that contain caffeine. These may worsen some of your symptoms. How to manage cravings Come up with a plan for how to deal with your cravings. The plan should include the following: A definition of the specific situation you want to deal with. An activity or action you will take to replace smoking. A clear idea for how this action will help. The name of someone who could help you with this. Cravings usually last for 5-10 minutes. Consider taking the following actions to help you with your plan to deal with cravings: Keep your  mouth busy. Chew sugar-free gum. Suck on hard candies or a straw. Brush your teeth. Keep your hands and body busy. Change to a different activity right away. Squeeze or play with a ball. Do an activity or a hobby, such as making bead jewelry, practicing needlepoint, or working with wood. Mix up your normal routine. Take a short exercise break. Go for a quick walk, or run up and down stairs. Focus on doing something kind or helpful for someone else. Call a friend or family member to talk during a craving. Join a support group. Contact a quitline. Where to find support To get help or find a support group: Call the National Cancer Institute's Smoking Quitline: 1-800-QUIT-NOW 386-132-5744) Text QUIT to SmokefreeTXT: 454098 Where to find  more information Visit these websites to find more information on quitting smoking: U.S. Department of Health and Human Services: www.smokefree.gov American Lung Association: www.freedomfromsmoking.org Centers for Disease Control and Prevention (CDC): FootballExhibition.com.br American Heart Association: www.heart.org Contact a health care provider if: You want to change your plan for quitting. The medicines you are taking are not helping. Your eating feels out of control or you cannot sleep. You feel depressed or become very anxious. Summary Quitting smoking is a physical and mental challenge. You will face cravings, withdrawal symptoms, and temptation to smoke again. Preparation can help you as you go through these challenges. Try different techniques to manage stress, handle social situations, and prevent weight gain. You can deal with cravings by keeping your mouth busy (such as by chewing gum), keeping your hands and body busy, calling family or friends, or contacting a quitline for people who want to quit smoking. You can deal with withdrawal symptoms by avoiding places where people smoke, getting plenty of rest, and avoiding drinks that contain caffeine. This information is not intended to replace advice given to you by your health care provider. Make sure you discuss any questions you have with your health care provider. Document Revised: 07/15/2021 Document Reviewed: 07/15/2021 Elsevier Patient Education  2024 [employer].

## 2023-03-08 DIAGNOSIS — Z419 Encounter for procedure for purposes other than remedying health state, unspecified: Secondary | ICD-10-CM | POA: Diagnosis not present

## 2023-04-03 ENCOUNTER — Ambulatory Visit (INDEPENDENT_AMBULATORY_CARE_PROVIDER_SITE_OTHER): Payer: 59

## 2023-04-03 ENCOUNTER — Ambulatory Visit: Payer: 59 | Attending: Cardiovascular Disease

## 2023-04-03 DIAGNOSIS — Z95820 Peripheral vascular angioplasty status with implants and grafts: Secondary | ICD-10-CM | POA: Diagnosis not present

## 2023-04-03 DIAGNOSIS — I739 Peripheral vascular disease, unspecified: Secondary | ICD-10-CM

## 2023-04-05 LAB — VAS US ABI WITH/WO TBI
Left ABI: 0.94
Right ABI: 0.94

## 2023-04-08 DIAGNOSIS — Z419 Encounter for procedure for purposes other than remedying health state, unspecified: Secondary | ICD-10-CM | POA: Diagnosis not present

## 2023-04-11 DIAGNOSIS — Z7982 Long term (current) use of aspirin: Secondary | ICD-10-CM | POA: Diagnosis not present

## 2023-04-11 DIAGNOSIS — Z8249 Family history of ischemic heart disease and other diseases of the circulatory system: Secondary | ICD-10-CM | POA: Diagnosis not present

## 2023-04-11 DIAGNOSIS — Z008 Encounter for other general examination: Secondary | ICD-10-CM | POA: Diagnosis not present

## 2023-04-11 DIAGNOSIS — K219 Gastro-esophageal reflux disease without esophagitis: Secondary | ICD-10-CM | POA: Diagnosis not present

## 2023-04-11 DIAGNOSIS — R030 Elevated blood-pressure reading, without diagnosis of hypertension: Secondary | ICD-10-CM | POA: Diagnosis not present

## 2023-04-11 DIAGNOSIS — F17210 Nicotine dependence, cigarettes, uncomplicated: Secondary | ICD-10-CM | POA: Diagnosis not present

## 2023-04-11 DIAGNOSIS — E669 Obesity, unspecified: Secondary | ICD-10-CM | POA: Diagnosis not present

## 2023-04-11 DIAGNOSIS — Z6833 Body mass index (BMI) 33.0-33.9, adult: Secondary | ICD-10-CM | POA: Diagnosis not present

## 2023-04-11 DIAGNOSIS — I739 Peripheral vascular disease, unspecified: Secondary | ICD-10-CM | POA: Diagnosis not present

## 2023-04-11 DIAGNOSIS — E785 Hyperlipidemia, unspecified: Secondary | ICD-10-CM | POA: Diagnosis not present

## 2023-04-12 ENCOUNTER — Telehealth: Payer: Self-pay | Admitting: Cardiovascular Disease

## 2023-04-12 DIAGNOSIS — Z136 Encounter for screening for cardiovascular disorders: Secondary | ICD-10-CM

## 2023-04-12 NOTE — Telephone Encounter (Signed)
Patient made aware of results and verbalized understanding. Orders have been placed.    Borderline reduced ABI.  The ultrasound showed possible aortic aneurysm but I believe that was artifact due to her previous stent and calcifications.  In order to evaluate this better, I recommend CTA of the abdominal aorta and iliac arteries (CT angio abdomen and pelvis)

## 2023-04-12 NOTE — Telephone Encounter (Signed)
Patient was returning phone call 

## 2023-04-16 ENCOUNTER — Ambulatory Visit: Payer: Self-pay | Admitting: Family

## 2023-04-30 ENCOUNTER — Encounter: Payer: Self-pay | Admitting: Radiology

## 2023-04-30 ENCOUNTER — Ambulatory Visit
Admission: RE | Admit: 2023-04-30 | Discharge: 2023-04-30 | Disposition: A | Discharge status: Home / Self Care | Payer: 59 | Source: Ambulatory Visit | Attending: Cardiovascular Disease | Admitting: Cardiovascular Disease

## 2023-04-30 DIAGNOSIS — K5710 Diverticulosis of small intestine without perforation or abscess without bleeding: Secondary | ICD-10-CM | POA: Diagnosis not present

## 2023-04-30 DIAGNOSIS — Z136 Encounter for screening for cardiovascular disorders: Secondary | ICD-10-CM | POA: Diagnosis not present

## 2023-04-30 MED ORDER — IOHEXOL 350 MG/ML SOLN
100.0000 mL | Freq: Once | INTRAVENOUS | Status: AC | PRN
  Administered 2023-04-30: 100 mL via INTRAVENOUS

## 2023-05-01 ENCOUNTER — Ambulatory Visit: Payer: 59

## 2023-05-08 DIAGNOSIS — Z419 Encounter for procedure for purposes other than remedying health state, unspecified: Secondary | ICD-10-CM | POA: Diagnosis not present

## 2023-05-18 ENCOUNTER — Ambulatory Visit: Payer: 59 | Admitting: Cardiovascular Disease

## 2023-05-24 ENCOUNTER — Other Ambulatory Visit: Payer: Self-pay | Admitting: Cardiovascular Disease

## 2023-05-24 DIAGNOSIS — I739 Peripheral vascular disease, unspecified: Secondary | ICD-10-CM

## 2023-05-24 DIAGNOSIS — I714 Abdominal aortic aneurysm, without rupture, unspecified: Secondary | ICD-10-CM

## 2023-06-08 DIAGNOSIS — Z419 Encounter for procedure for purposes other than remedying health state, unspecified: Secondary | ICD-10-CM | POA: Diagnosis not present

## 2023-07-08 DIAGNOSIS — Z419 Encounter for procedure for purposes other than remedying health state, unspecified: Secondary | ICD-10-CM | POA: Diagnosis not present

## 2023-08-08 DIAGNOSIS — Z419 Encounter for procedure for purposes other than remedying health state, unspecified: Secondary | ICD-10-CM | POA: Diagnosis not present

## 2023-08-17 ENCOUNTER — Ambulatory Visit: Payer: 59 | Admitting: Cardiovascular Disease

## 2023-08-17 ENCOUNTER — Ambulatory Visit: Payer: 59 | Admitting: Cardiology

## 2023-08-17 ENCOUNTER — Telehealth: Payer: Self-pay | Admitting: Cardiovascular Disease

## 2023-08-17 NOTE — Telephone Encounter (Signed)
 LVM to move appt up due to weather, please contact [facility] for reschedule

## 2023-08-27 ENCOUNTER — Encounter: Payer: Self-pay | Admitting: Cardiology

## 2023-08-27 ENCOUNTER — Ambulatory Visit: Payer: 59 | Attending: Cardiology | Admitting: Cardiology

## 2023-08-27 ENCOUNTER — Ambulatory Visit: Payer: 59 | Admitting: Cardiology

## 2023-08-27 VITALS — BP 118/80 | HR 82 | Ht 66.0 in | Wt 207.2 lb

## 2023-08-27 DIAGNOSIS — I739 Peripheral vascular disease, unspecified: Secondary | ICD-10-CM | POA: Diagnosis not present

## 2023-08-27 DIAGNOSIS — I998 Other disorder of circulatory system: Secondary | ICD-10-CM | POA: Diagnosis not present

## 2023-08-27 DIAGNOSIS — E785 Hyperlipidemia, unspecified: Secondary | ICD-10-CM | POA: Diagnosis not present

## 2023-08-27 DIAGNOSIS — Z720 Tobacco use: Secondary | ICD-10-CM | POA: Diagnosis not present

## 2023-08-27 NOTE — Patient Instructions (Signed)
Medication Instructions:  No changes at this time.   *If you need a refill on your cardiac medications before your next appointment, please call your pharmacy*   Lab Work: CBC, CMP, Lipid today here in the office.   If you have labs (blood work) drawn today and your tests are completely normal, you will receive your results only by: MyChart Message (if you have MyChart) OR A paper copy in the mail If you have any lab test that is abnormal or we need to change your treatment, we will call you to review the results.   Testing/Procedures: Your physician has requested that you have an ankle brachial index (ABI). During this test an ultrasound and blood pressure cuff are used to evaluate the arteries that supply the arms and legs with blood. Allow thirty minutes for this exam. There are no restrictions or special instructions.  Please note: We ask at that you not bring children with you during ultrasound (echo/ vascular) testing. Due to room size and safety concerns, children are not allowed in the ultrasound rooms during exams. Our front office staff cannot provide observation of children in our lobby area while testing is being conducted. An adult accompanying a patient to their appointment will only be allowed in the ultrasound room at the discretion of the ultrasound technician under special circumstances. We apologize for any inconvenience.    Follow-Up: At [facility], you and your health needs are our priority.  As part of our continuing mission to provide you with exceptional heart care, we have created designated Provider Care Teams.  These Care Teams include your primary Cardiologist (physician) and Advanced Practice Providers (APPs -  Physician Assistants and Nurse Practitioners) who all work together to provide you with the care you need, when you need it.    Your next appointment:   Follow up after testing with Dr. Kirke Corin

## 2023-08-27 NOTE — Progress Notes (Signed)
Cardiology Office Note:  .   Date:  08/27/2023  ID:  Grace Evans, Grace Evans [birth year], MRN [id number] PCP: Leanna Sato, MD  [facility] Providers Cardiologist:  None    History of Present Illness: .   Grace Evans is a 48 y.o. female with a past medical history of tobacco use, GERD, hyperlipidemia, peripheral arterial disease, and a strong extensive cardiovascular family history, who is here today for follow-up.   She was previously hospitalized in February 2022 with gangrenous right fifth toe.  She underwent an angiogram by Dr. Wyn Quaker which revealed evidence of significant stenosis in the infrarenal abdominal aorta at least 60%.  There was no significant infrainguinal disease that was noted but the blood flow was very sluggish to the right foot.  The infrarenal aortic stenosis was treated with a 14 x 6 cm stent and balloon dilatation.   She was last seen in clinic 02/15/2023 after being lost to follow-up.  She had had lack of health insurance and just recently got Medicaid.  She continued to have complaints of severe bilateral toe pain at rest and with walking with occasional calf pain.  Symptoms were slightly worse on the left than on the right.  She also had intermittent dark toes colorization of her small toes.  She had continued to smoke 1 pack/day and been smoking since she was 48 years old.  She had also noted 1 brief episode of chest pain lasting a few minutes with rest but denied any exertional symptoms.  She was scheduled for an ABI in aortoiliac duplex.  She was also started on aspirin 81 mg daily, atorvastatin 40 mg daily, and continued discussion about smoking cessation.  She returns to clinic today stating that overall she has been doing well with the exception of  continuing to have bilateral leg pain on exertion and right and left pinky toe pain with ambulation. She notes discoloration to the right foot and toes on occasion but not to the extent they were with her prior procedure  in 2022. She denies any chest pain, worsening shortness of breath, palpitations, or peripheral edema.  Unfortunately she continues to smoke about a pack per day and has since the age of 42.  She states she has been compliant with taking her aspirin and statin as previously prescribed.  ROS: 10 point review of systems has been reviewed and considered negative with the exception was been listed in the HPI  Studies Reviewed: Marland Kitchen   EKG Interpretation Date/Time:  Monday August 27 2023 11:21:14 EST Ventricular Rate:  82 PR Interval:  134 QRS Duration:  88 QT Interval:  392 QTC Calculation: 457 R Axis:   -29  Text Interpretation: Normal sinus rhythm Low voltage QRS Cannot rule out Anterior infarct (cited on or before 27-Jul-2004) When compared with ECG of 15-Feb-2023 14:16, No significant change was found Confirmed by Charlsie Quest ([id number]) on 08/27/2023 11:24:51 AM    CTA abd/pelvis 04/30/2023 VASCULAR 1. Prior stent graft coverage of a region of intimal disruption and wall adherent mural thrombus. The stent graft is widely patent with only mild in stent stenosis resulting in less than 50% luminal loss. 2. No evidence of aortic aneurysm, dissection or other acute abnormality.  Aorta/IVC/Iliacs ultrasound 04/03/2023 Summary:  Abdominal Aorta: The largest aortic measurement is 5.3 cm. Suboptimal  study in which the length of the abdominal aorta appears ectatic, if not  focally aneurysmal. The largest aortic diameter has increased compared to  prior exam. Aortic measurement  is  likely not accurate due to shadow artifact related to calcifications.  Recommend CTA.   ABI's 04/03/23 Summary:  Right: Resting right ankle-brachial index indicates mild right lower  extremity arterial disease. The right toe-brachial index is abnormal.   Left: Resting left ankle-brachial index indicates mild left lower  extremity arterial disease. The left toe-brachial index is normal.   Risk  Assessment/Calculations:             Physical Exam:   VS:  BP 118/80   Pulse 82   Ht 5\' 6"  (1.676 m)   Wt 207 lb 3.2 oz (94 kg)   SpO2 98%   BMI 33.44 kg/m    Wt Readings from Last 3 Encounters:  08/27/23 207 lb 3.2 oz (94 kg)  02/15/23 198 lb 2 oz (89.9 kg)  09/28/20 214 lb 15.2 oz (97.5 kg)    GEN: Well nourished, well developed in no acute distress NECK: No JVD; No carotid bruits CARDIAC: RRR, no murmurs, rubs, gallops RESPIRATORY:  Clear to auscultation without rales, wheezing or rhonchi  ABDOMEN: Soft, non-tender, non-distended EXTREMITIES:  No edema; No deformity, femoral pulses are +1 bilaterally, posterior tibial was able to be found with Doppler as well as a weak dorsalis pedis bilaterally, she has no open ulcerations noted to the foot but she does have a crusted area to the outer aspect of her right little toe and underneath there is a black scabbed area between her middle and fourth toe.  Left foot without any areas of previous ulcerations or wounds noted just dry scaling flaking skin.  ASSESSMENT AND PLAN: .   Peripheral arterial disease with history of critical limb ischemia affecting the right lower extremity status post stent placement in the infrarenal abdominal aorta.  She continues to complain of intermittent claudication and toe pain to the right and left foot.  Previously had ABI's completed which revealed mild mild right and left lower extremity arterial disease with right toe brachial index is abnormal and the left toe brachial index is normal.  Aorta IVC and iliac ultrasound recommended CTA to be completed due to concerns for abdominal aorta measuring 5.3 cm.  CTA revealed no abdominal aortic aneurysm or dissection.  She has been continued on aspirin 81 mg daily and atorvastatin 40 mg daily.  Will recheck routine labs today as she has not had any since previously being started on this medication regimen.  She is also being scheduled for repeat ABIs.  We did discuss  potential of medications as far as cilostazol and Xarelto 2.5 mg twice daily and other medication changes after further discussion were deferred until testing has been completed.  Hyperlipidemia secondary to peripheral arterial disease.  Was recently started on atorvastatin 40 mg once daily in July repeat lipid panel and hepatic panel completed today.  Tobacco abuse where cessation is recommended.  At this time she is not interested in stopping smoking and has declined patches lozenges, or other medications.       Dispo: Patient return to clinic to see MD/APP once testing is completed or sooner if needed for further evaluation.  Patient had been advised for any free pedis symptoms like darkened disc colorization to the right lower extremity or toes that she should seek emergency medical services.  Signed, Williard Keller, NP

## 2023-08-28 LAB — CBC
Hematocrit: 46.1 % (ref 34.0–46.6)
Hemoglobin: 15 g/dL (ref 11.1–15.9)
MCH: 29.2 pg (ref 26.6–33.0)
MCHC: 32.5 g/dL (ref 31.5–35.7)
MCV: 90 fL (ref 79–97)
Platelets: 264 10*3/uL (ref 150–450)
RBC: 5.13 x10E6/uL (ref 3.77–5.28)
RDW: 12.2 % (ref 11.7–15.4)
WBC: 11.9 10*3/uL — ABNORMAL HIGH (ref 3.4–10.8)

## 2023-08-28 LAB — COMPREHENSIVE METABOLIC PANEL
ALT: 14 [IU]/L (ref 0–32)
AST: 16 [IU]/L (ref 0–40)
Albumin: 4 g/dL (ref 3.9–4.9)
Alkaline Phosphatase: 95 [IU]/L (ref 44–121)
BUN/Creatinine Ratio: 7 — ABNORMAL LOW (ref 9–23)
BUN: 5 mg/dL — ABNORMAL LOW (ref 6–24)
Bilirubin Total: 0.7 mg/dL (ref 0.0–1.2)
CO2: 26 mmol/L (ref 20–29)
Calcium: 8.9 mg/dL (ref 8.7–10.2)
Chloride: 102 mmol/L (ref 96–106)
Creatinine, Ser: 0.68 mg/dL (ref 0.57–1.00)
Globulin, Total: 2.2 g/dL (ref 1.5–4.5)
Glucose: 78 mg/dL (ref 70–99)
Potassium: 4 mmol/L (ref 3.5–5.2)
Sodium: 143 mmol/L (ref 134–144)
Total Protein: 6.2 g/dL (ref 6.0–8.5)
eGFR: 108 mL/min/{1.73_m2} (ref >59)

## 2023-08-28 LAB — LIPID PANEL
Chol/HDL Ratio: 3.6 {ratio} (ref 0.0–4.4)
Cholesterol, Total: 152 mg/dL (ref 100–199)
HDL: 42 mg/dL (ref >39)
LDL Chol Calc (NIH): 90 mg/dL (ref 0–99)
Triglycerides: 112 mg/dL (ref 0–149)
VLDL Cholesterol Cal: 20 mg/dL (ref 5–40)

## 2023-08-28 NOTE — Progress Notes (Signed)
Kidney function and electrolytes remain stable.  Blood counts are stable.  LDL or bad cholesterol shows great improvement since being on current cholesterol medication.  Continue current medication without changes needed at this time.

## 2023-08-31 ENCOUNTER — Encounter: Payer: Self-pay | Admitting: *Deleted

## 2023-09-08 DIAGNOSIS — Z419 Encounter for procedure for purposes other than remedying health state, unspecified: Secondary | ICD-10-CM | POA: Diagnosis not present

## 2023-09-13 ENCOUNTER — Ambulatory Visit: Payer: 59 | Attending: Cardiology

## 2023-09-13 DIAGNOSIS — I739 Peripheral vascular disease, unspecified: Secondary | ICD-10-CM | POA: Diagnosis not present

## 2023-09-14 LAB — VAS US ABI WITH/WO TBI
Left ABI: 0.88
Right ABI: 0.88

## 2023-09-17 NOTE — Progress Notes (Signed)
 Right lower extremity shows mild disease or narrowing of the arteries in the leg, right toe has normal flow.  Left leg has normal flow.  Recommend repeating study in 12 months to assess for any disease progression.

## 2023-09-18 ENCOUNTER — Other Ambulatory Visit: Payer: Self-pay | Admitting: *Deleted

## 2023-09-18 DIAGNOSIS — I739 Peripheral vascular disease, unspecified: Secondary | ICD-10-CM

## 2023-10-06 DIAGNOSIS — Z419 Encounter for procedure for purposes other than remedying health state, unspecified: Secondary | ICD-10-CM | POA: Diagnosis not present

## 2023-10-09 ENCOUNTER — Encounter: Payer: Self-pay | Admitting: Cardiovascular Disease

## 2023-10-09 ENCOUNTER — Ambulatory Visit: Payer: 59 | Attending: Cardiovascular Disease | Admitting: Cardiovascular Disease

## 2023-10-09 VITALS — BP 118/80 | HR 91 | Ht 66.0 in | Wt 206.4 lb

## 2023-10-09 DIAGNOSIS — Z720 Tobacco use: Secondary | ICD-10-CM

## 2023-10-09 DIAGNOSIS — E785 Hyperlipidemia, unspecified: Secondary | ICD-10-CM | POA: Diagnosis not present

## 2023-10-09 DIAGNOSIS — I739 Peripheral vascular disease, unspecified: Secondary | ICD-10-CM | POA: Diagnosis not present

## 2023-10-09 MED ORDER — CILOSTAZOL 50 MG PO TABS: 50.0000 mg | ORAL_TABLET | Freq: Two times a day (BID) | ORAL | 5 refills | Status: DC

## 2023-10-09 NOTE — Patient Instructions (Signed)
 Medication Instructions:  START Cilostazol (Pletal) 50 mg twice daily  *If you need a refill on your cardiac medications before your next appointment, please call your pharmacy*   Lab Work: None ordered If you have labs (blood work) drawn today and your tests are completely normal, you will receive your results only by: MyChart Message (if you have MyChart) OR A paper copy in the mail If you have any lab test that is abnormal or we need to change your treatment, we will call you to review the results.   Testing/Procedures: None ordered   Follow-Up: At [facility], you and your health needs are our priority.  As part of our continuing mission to provide you with exceptional heart care, we have created designated Provider Care Teams.  These Care Teams include your primary Cardiologist (physician) and Advanced Practice Providers (APPs -  Physician Assistants and Nurse Practitioners) who all work together to provide you with the care you need, when you need it.  We recommend signing up for the patient portal called "MyChart".  Sign up information is provided on this After Visit Summary.  MyChart is used to connect with patients for Virtual Visits (Telemedicine).  Patients are able to view lab/test results, encounter notes, upcoming appointments, etc.  Non-urgent messages can be sent to your provider as well.   To learn more about what you can do with MyChart, go to [email or web address].    Your next appointment:   6 month(s)  Provider:   You may see Dr. Kirke Corin or one of the following Advanced Practice Providers on your designated Care Team:   Nicolasa Ducking, NP Eula Listen, PA-C Cadence Fransico Michael, PA-C Charlsie Quest, NP Carlos Levering, NP    Other Instructions Managing the Challenge of Quitting Smoking Quitting smoking is a physical and mental challenge. You may have cravings, withdrawal symptoms, and temptation to smoke. Before quitting, work with your health care  provider to make a plan that can help you manage quitting. Making a plan before you quit may keep you from smoking when you have the urge to smoke while trying to quit. How to manage lifestyle changes Managing stress Stress can make you want to smoke, and wanting to smoke may cause stress. It is important to find ways to manage your stress. You could try some of the following: Practice relaxation techniques. Breathe slowly and deeply, in through your nose and out through your mouth. Listen to music. Soak in a bath or take a shower. Imagine a peaceful place or vacation. Get some support. Talk with family or friends about your stress. Join a support group. Talk with a counselor or therapist. Get some physical activity. Go for a walk, run, or bike ride. Play a favorite sport. Practice yoga.  Medicines Talk with your health care provider about medicines that might help you deal with cravings and make quitting easier for you. Relationships Social situations can be difficult when you are quitting smoking. To manage this, you can: Avoid parties and other social situations where people might be smoking. Avoid alcohol. Leave right away if you have the urge to smoke. Explain to your family and friends that you are quitting smoking. Ask for support and let them know you might be a bit grumpy. Plan activities where smoking is not an option. General instructions Be aware that many people gain weight after they quit smoking. However, not everyone does. To keep from gaining weight, have a plan in place before you quit, and stick to the  plan after you quit. Your plan should include: Eating healthy snacks. When you have a craving, it may help to: Eat popcorn, or try carrots, celery, or other cut vegetables. Chew sugar-free gum. Changing how you eat. Eat small portion sizes at meals. Eat 4-6 small meals throughout the day instead of 1-2 large meals a day. Be mindful when you eat. You should avoid  watching television or doing other things that might distract you as you eat. Exercising regularly. Make time to exercise each day. If you do not have time for a long workout, do short bouts of exercise for 5-10 minutes several times a day. Do some form of strengthening exercise, such as weight lifting. Do some exercise that gets your heart beating and causes you to breathe deeply, such as walking fast, running, swimming, or biking. This is very important. Drinking plenty of water or other low-calorie or no-calorie drinks. Drink enough fluid to keep your urine pale yellow.  How to recognize withdrawal symptoms Your body and mind may experience discomfort as you try to get used to not having nicotine in your system. These effects are called withdrawal symptoms. They may include: Feeling hungrier than normal. Having trouble concentrating. Feeling irritable or restless. Having trouble sleeping. Feeling depressed. Craving a cigarette. These symptoms may surprise you, but they are normal to have when quitting smoking. To manage withdrawal symptoms: Avoid places, people, and activities that trigger your cravings. Remember why you want to quit. Get plenty of sleep. Avoid coffee and other drinks that contain caffeine. These may worsen some of your symptoms. How to manage cravings Come up with a plan for how to deal with your cravings. The plan should include the following: A definition of the specific situation you want to deal with. An activity or action you will take to replace smoking. A clear idea for how this action will help. The name of someone who could help you with this. Cravings usually last for 5-10 minutes. Consider taking the following actions to help you with your plan to deal with cravings: Keep your mouth busy. Chew sugar-free gum. Suck on hard candies or a straw. Brush your teeth. Keep your hands and body busy. Change to a different activity right away. Squeeze or play  with a ball. Do an activity or a hobby, such as making bead jewelry, practicing needlepoint, or working with wood. Mix up your normal routine. Take a short exercise break. Go for a quick walk, or run up and down stairs. Focus on doing something kind or helpful for someone else. Call a friend or family member to talk during a craving. Join a support group. Contact a quitline. Where to find support To get help or find a support group: Call the National Cancer Institute's Smoking Quitline: 1-800-QUIT-NOW 3510859238) Text QUIT to SmokefreeTXT: 295621 Where to find more information Visit these websites to find more information on quitting smoking: U.S. Department of Health and Human Services: www.smokefree.gov American Lung Association: www.freedomfromsmoking.org Centers for Disease Control and Prevention (CDC): FootballExhibition.com.br American Heart Association: www.heart.org Contact a health care provider if: You want to change your plan for quitting. The medicines you are taking are not helping. Your eating feels out of control or you cannot sleep. You feel depressed or become very anxious. Summary Quitting smoking is a physical and mental challenge. You will face cravings, withdrawal symptoms, and temptation to smoke again. Preparation can help you as you go through these challenges. Try different techniques to manage stress, handle social situations, and  prevent weight gain. You can deal with cravings by keeping your mouth busy (such as by chewing gum), keeping your hands and body busy, calling family or friends, or contacting a quitline for people who want to quit smoking. You can deal with withdrawal symptoms by avoiding places where people smoke, getting plenty of rest, and avoiding drinks that contain caffeine. This information is not intended to replace advice given to you by your health care provider. Make sure you discuss any questions you have with your health care provider. Document Revised:  07/15/2021 Document Reviewed: 07/15/2021 Elsevier Patient Education  2024 [employer].

## 2023-10-09 NOTE — Progress Notes (Signed)
 Cardiology Office Note   Date:  10/09/2023   ID:  Iness, Pangilinan [birth year], MRN [id number]  PCP:  Leanna Sato, MD  Cardiologist:   Lorine Bears, MD   No chief complaint on file.     History of Present Illness: Grace Evans is a 48 y.o. female who is here today for a follow-up visit regarding peripheral arterial disease.   She has known history of tobacco use, GERD and hyperlipidemia. She was hospitalized in February 2022 with gangrenous right fifth toe.  She underwent an angiogram by Dr. Wyn Quaker which showed evidence of significant stenosis in the infrarenal abdominal aorta at least 60%.  No significant infrainguinal disease was noted but the blood flow was very sluggish to the right foot.  The infrarenal aortic stenosis was treated with a 14 x 6 cm life star self-expanding stent which was postdilated with a 14 mm balloon She smokes 1 pack/day.  She has mild bilateral leg claudication.  She underwent duplex last year that was mostly nondiagnostic due to stent artifact.  CTA was performed which showed patent aortic stent with moderate in-stent restenosis.  Recent ABI was mildly reduced at 0.88 bilaterally.   Past Medical History:  Diagnosis Date   GERD (gastroesophageal reflux disease)    Hyperlipidemia     Past Surgical History:  Procedure Laterality Date   CHOLECYSTECTOMY     LOWER EXTREMITY ANGIOGRAPHY Right 09/30/2020   Procedure: Lower Extremity Angiography;  Surgeon: Annice Needy, MD;  Location: [facility] INVASIVE CV LAB;  Service: Cardiovascular;  Laterality: Right;     Current Outpatient Medications  Medication Sig Dispense Refill   aspirin EC 81 MG EC tablet Take 1 tablet (81 mg total) by mouth daily. Swallow whole. 30 tablet 2   atorvastatin (LIPITOR) 40 MG tablet Take 1 tablet (40 mg total) by mouth daily. 90 tablet 1   cilostazol (PLETAL) 50 MG tablet Take 1 tablet (50 mg total) by mouth 2 (two) times daily. 60 tablet 5   albuterol (VENTOLIN HFA) 108 (90 Base)  MCG/ACT inhaler Inhale 2 puffs into the lungs every 4 (four) hours as needed for wheezing or shortness of breath. (Patient not taking: Reported on 10/09/2023) 18 g 0   amoxicillin (AMOXIL) 500 MG capsule Take 1 capsule (500 mg total) by mouth 3 (three) times daily. (Patient not taking: Reported on 10/09/2023) 30 capsule 0   cyclobenzaprine (FLEXERIL) 10 MG tablet Take 0.5 tablets (5 mg total) by mouth 3 (three) times daily as needed for muscle spasms. (Patient not taking: Reported on 02/15/2023) 30 tablet 0   dibucaine (NUPERCAINAL) 1 % OINT Place 1 application rectally 3 (three) times daily as needed. (Patient not taking: Reported on 02/15/2023) 30 g 0   lidocaine (XYLOCAINE) 2 % solution Use as directed 10 mLs in the mouth or throat every 4 (four) hours as needed (sore throat). Gargle and swallow (Patient not taking: Reported on 02/15/2023) 200 mL 0   meloxicam (MOBIC) 15 MG tablet Take 1 tablet (15 mg total) by mouth daily. (Patient not taking: Reported on 02/15/2023) 30 tablet 0   methocarbamol (ROBAXIN) 500 MG tablet Take 1 tablet (500 mg total) by mouth 2 (two) times daily. (Patient not taking: Reported on 02/15/2023) 20 tablet 0   nicotine (NICODERM CQ - DOSED IN MG/24 HOURS) 14 mg/24hr patch Place 1 patch (14 mg total) onto the skin daily. (Patient not taking: Reported on 02/15/2023) 28 patch 0   nystatin cream (MYCOSTATIN) Apply to affected  area 2 times daily (Patient not taking: Reported on 02/15/2023) 80 g 0   ondansetron (ZOFRAN) 4 MG tablet Take 4 mg by mouth every 8 (eight) hours as needed for nausea or vomiting. (Patient not taking: Reported on 02/15/2023)     promethazine-dextromethorphan (PROMETHAZINE-DM) 6.25-15 MG/5ML syrup Take 5 mLs by mouth 4 (four) times daily as needed. (Patient not taking: Reported on 02/15/2023) 100 mL 0   traMADol (ULTRAM) 50 MG tablet Take 1 tablet (50 mg total) by mouth every 6 (six) hours as needed. (Patient not taking: Reported on 02/15/2023) 15 tablet 0   No current  facility-administered medications for this visit.    Allergies:   Patient has no known allergies.    Social History:  The patient  reports that she has been smoking cigarettes. She started smoking about 31 years ago. She has a 31.6 pack-year smoking history. She has never used smokeless tobacco. She reports that she does not drink alcohol and does not use drugs.   Family History:  The patient's family history includes Congestive Heart Failure in her father; Diabetes in her maternal grandmother; Heart attack in her father and mother; Heart disease in her father and mother; Heart failure in her father and mother; Hyperlipidemia in her father; Seizures in her son; Stroke in her maternal grandfather and maternal grandmother; Thyroid cancer in her mother.    ROS:  Please see the history of present illness.   Otherwise, review of systems are positive for none.   All other systems are reviewed and negative.    PHYSICAL EXAM: VS:  BP 118/80   Pulse 91   Ht 5\' 6"  (1.676 m)   Wt 206 lb 6.4 oz (93.6 kg)   SpO2 97%   BMI 33.31 kg/m  , BMI Body mass index is 33.31 kg/m. GEN: Well nourished, well developed, in no acute distress  HEENT: normal  Neck: no JVD, carotid bruits, or masses Cardiac: RRR; no murmurs, rubs, or gallops,no edema  Respiratory:  clear to auscultation bilaterally, normal work of breathing GI: soft, nontender, nondistended, + BS MS: no deformity or atrophy  Skin: warm and dry, no rash Neuro:  Strength and sensation are intact Psych: euthymic mood, full affect Vascular: Femoral pulse is +1 bilaterally.  Dorsalis pedis is +1 bilaterally.  Posterior tibial is not palpable.   EKG:  EKG is not ordered today.    Recent Labs: 08/27/2023: ALT 14; BUN 5; Creatinine, Ser 0.68; Hemoglobin 15.0; Platelets 264; Potassium 4.0; Sodium 143    Lipid Panel    Component Value Date/Time   CHOL 152 08/27/2023 1201   TRIG 112 08/27/2023 1201   HDL 42 08/27/2023 1201   CHOLHDL 3.6  08/27/2023 1201   CHOLHDL 6.3 02/15/2023 1503   VLDL 27 02/15/2023 1503   LDLCALC 90 08/27/2023 1201      Wt Readings from Last 3 Encounters:  10/09/23 206 lb 6.4 oz (93.6 kg)  08/27/23 207 lb 3.2 oz (94 kg)  02/15/23 198 lb 2 oz (89.9 kg)         02/15/2023    2:08 PM  PAD Screen  Previous PAD dx? No  Previous surgical procedure? Yes  Pain with walking? Yes  Subsides with rest? Yes  Feet/toe relief with dangling? Yes  Painful, non-healing ulcers? No  Extremities discolored? Yes      ASSESSMENT AND PLAN:  1.  Peripheral arterial disease: History of critical limb ischemia affecting the right lower extremity status post self-expanding stent placement to the  infrarenal abdominal aorta.  She has mild to moderate bilateral leg claudication likely due to residual stenosis in the aortic stent.  However, her symptoms are not severe enough to justify optimizing the stent. I elected to add cilostazol 50 mg twice daily to improve her symptoms.  2.  Tobacco use: I discussed with her the importance of smoking cessation.  3.  Hyperlipidemia: She was started on atorvastatin last year.  She is tolerating the medication.  Most recent lipid profile showed an LDL of 90.  Will consider adding ezetimibe if LDL remains above 70.    Disposition:   FU with me in 6 months  Signed,  Lorine Bears, MD  10/09/2023 4:15 PM    [facility]

## 2023-10-24 ENCOUNTER — Ambulatory Visit
Admission: EM | Admit: 2023-10-24 | Discharge: 2023-10-24 | Disposition: A | Discharge status: Home / Self Care | Attending: Nurse Practitioner | Admitting: Nurse Practitioner

## 2023-10-24 DIAGNOSIS — J069 Acute upper respiratory infection, unspecified: Secondary | ICD-10-CM

## 2023-10-24 DIAGNOSIS — J441 Chronic obstructive pulmonary disease with (acute) exacerbation: Secondary | ICD-10-CM | POA: Diagnosis not present

## 2023-10-24 DIAGNOSIS — R059 Cough, unspecified: Secondary | ICD-10-CM

## 2023-10-24 LAB — POC COVID19/FLU A&B COMBO
Covid Antigen, POC: NEGATIVE
Influenza A Antigen, POC: NEGATIVE
Influenza B Antigen, POC: NEGATIVE

## 2023-10-24 MED ORDER — IPRATROPIUM-ALBUTEROL 0.5-2.5 (3) MG/3ML IN SOLN
3.0000 mL | Freq: Once | RESPIRATORY_TRACT | Status: AC
  Administered 2023-10-24: 3 mL via RESPIRATORY_TRACT

## 2023-10-24 MED ORDER — PREDNISONE 20 MG PO TABS: 40.0000 mg | ORAL_TABLET | Freq: Every day | ORAL | 0 refills | Status: AC

## 2023-10-24 MED ORDER — PROMETHAZINE-DM 6.25-15 MG/5ML PO SYRP: 5.0000 mL | ORAL_SOLUTION | Freq: Four times a day (QID) | ORAL | 0 refills | Status: DC | PRN

## 2023-10-24 MED ORDER — METHYLPREDNISOLONE SODIUM SUCC 125 MG IJ SOLR
125.0000 mg | Freq: Once | INTRAMUSCULAR | Status: AC
  Administered 2023-10-24: 125 mg via INTRAMUSCULAR

## 2023-10-24 MED ORDER — ALBUTEROL SULFATE HFA 108 (90 BASE) MCG/ACT IN AERS: 2.0000 | INHALATION_SPRAY | Freq: Four times a day (QID) | RESPIRATORY_TRACT | 0 refills | Status: AC | PRN

## 2023-10-24 MED ORDER — AZITHROMYCIN 250 MG PO TABS: 250.0000 mg | ORAL_TABLET | Freq: Every day | ORAL | 0 refills | Status: DC

## 2023-10-24 NOTE — ED Triage Notes (Signed)
 Pt reports she has chest pain, mucus, cough, and weak x 4 days

## 2023-10-24 NOTE — ED Provider Notes (Signed)
 [facility]    CSN: [id number] Arrival date & time: 10/24/23  1338      History   Chief Complaint No chief complaint on file.   HPI Grace Evans is a 48 y.o. female.   The history is provided by the patient.   Patient presents with a 4-day history of chest pain, chest congestion, cough, wheezing, and fatigue.  Denies fever, chills, ear pain, ear drainage, difficulty breathing, abdominal pain, nausea, vomiting, diarrhea, or rash.  Patient states she has been taking over-the-counter cough and cold medications for her symptoms.  She reports history of smoking, states she has not been smoking as much since her symptoms started.  Patient also endorses underlying history of COPD.  Past Medical History:  Diagnosis Date   GERD (gastroesophageal reflux disease)    Hyperlipidemia     Patient Active Problem List   Diagnosis Date Noted   Watery diarrhea 09/28/2020   Abdominal pain 09/28/2020   Ischemic ulcer of toe of right foot, with unspecified severity (HCC) 09/27/2020   Ischemic toe 09/27/2020   Nicotine dependence, cigarettes, uncomplicated 09/27/2020   Ischemic toe ulcer, right, with unspecified severity (HCC) 09/27/2020   Obesity, unspecified 05/08/2012   History of panic attacks 07/24/2006    Past Surgical History:  Procedure Laterality Date   CHOLECYSTECTOMY     LOWER EXTREMITY ANGIOGRAPHY Right 09/30/2020   Procedure: Lower Extremity Angiography;  Surgeon: Annice Needy, MD;  Location: [facility] IN[facility]ASIVE [facility];  Service: Cardiovascular;  Laterality: Right;    OB History     Gravida  2   Para      Term      Preterm      AB  1   Living  1      SAB      IAB      Ectopic      Multiple      Live Births               Home Medications    Prior to Admission medications   Medication Sig Start Date End Date Taking? Authorizing Provider  albuterol (VENTOLIN HFA) 108 (90 Base) MCG/ACT inhaler Inhale 2 puffs into the lungs every 4 (four)  hours as needed for wheezing or shortness of breath. Patient not taking: Reported on 10/09/2023 08/14/22   Particia Nearing, PA-C  amoxicillin (AMOXIL) 500 MG capsule Take 1 capsule (500 mg total) by mouth 3 (three) times daily. Patient not taking: Reported on 10/09/2023 07/18/21   Osie Cheeks  aspirin EC 81 MG EC tablet Take 1 tablet (81 mg total) by mouth daily. Swallow whole. 10/02/20   Pennie Banter, DO  atorvastatin (LIPITOR) 40 MG tablet Take 1 tablet (40 mg total) by mouth daily. 02/15/23 10/09/23  Iran Ouch, MD  cilostazol (PLETAL) 50 MG tablet Take 1 tablet (50 mg total) by mouth 2 (two) times daily. 10/09/23   Iran Ouch, MD  cyclobenzaprine (FLEXERIL) 10 MG tablet Take 0.5 tablets (5 mg total) by mouth 3 (three) times daily as needed for muscle spasms. Patient not taking: Reported on 02/15/2023 05/27/20   Triplett, Rulon Eisenmenger B, FNP  dibucaine (NUPERCAINAL) 1 % OINT Place 1 application rectally 3 (three) times daily as needed. Patient not taking: Reported on 02/15/2023 10/24/19   Menshew, Charlesetta Ivory, PA-C  lidocaine (XYLOCAINE) 2 % solution Use as directed 10 mLs in the mouth or throat every 4 (four) hours as needed (sore throat). Gargle  and swallow Patient not taking: Reported on 02/15/2023 04/05/20   Cuthriell, Delorise Royals, PA-C  meloxicam (MOBIC) 15 MG tablet Take 1 tablet (15 mg total) by mouth daily. Patient not taking: Reported on 02/15/2023 05/27/20   Kem Boroughs B, FNP  methocarbamol (ROBAXIN) 500 MG tablet Take 1 tablet (500 mg total) by mouth 2 (two) times daily. Patient not taking: Reported on 02/15/2023 01/07/20   Maxwell Caul, PA-C  nicotine (NICODERM CQ - DOSED IN MG/24 HOURS) 14 mg/24hr patch Place 1 patch (14 mg total) onto the skin daily. Patient not taking: Reported on 02/15/2023 10/02/20   Esaw Grandchild A, DO  nystatin cream (MYCOSTATIN) Apply to affected area 2 times daily Patient not taking: Reported on 02/15/2023 08/14/22   Particia Nearing,  PA-C  ondansetron (ZOFRAN) 4 MG tablet Take 4 mg by mouth every 8 (eight) hours as needed for nausea or vomiting. Patient not taking: Reported on 02/15/2023    [provider]  promethazine-dextromethorphan (PROMETHAZINE-DM) 6.25-15 MG/5ML syrup Take 5 mLs by mouth 4 (four) times daily as needed. Patient not taking: Reported on 02/15/2023 08/14/22   Particia Nearing, PA-C  traMADol (ULTRAM) 50 MG tablet Take 1 tablet (50 mg total) by mouth every 6 (six) hours as needed. Patient not taking: Reported on 02/15/2023 09/12/20   Faythe Ghee, PA-C  fluticasone [facility]) 50 MCG/ACT nasal spray Place 1 spray into both nostrils 2 (two) times daily. Patient not taking: Reported on 05/16/2019 09/15/17 10/24/19  Cuthriell, Delorise Royals, PA-C    Family History Family History  Problem Relation Age of Onset   Heart failure Mother    Heart disease Mother    Thyroid cancer Mother    Heart attack Mother    Heart disease Father    Heart failure Father    Congestive Heart Failure Father    Heart attack Father    Hyperlipidemia Father    Diabetes Maternal Grandmother    Stroke Maternal Grandmother    Stroke Maternal Grandfather    Seizures Son     Social History Social History   Tobacco Use   Smoking status: Every Day    Current packs/day: 1.00    Average packs/day: 1 pack/day for 31.7 years (31.7 ttl pk-yrs)    Types: Cigarettes    Start date: 02/15/1992   Smokeless tobacco: Never  Vaping Use   Vaping status: Never Used  Substance Use Topics   Alcohol use: No   Drug use: No     Allergies   Patient has no known allergies.   Review of Systems Review of Systems Per HPI  Physical Exam Triage Vital Signs ED Triage Vitals [10/24/23 1510]  Encounter Vitals Group     BP      Systolic BP Percentile      Diastolic BP Percentile      Pulse      Resp      Temp      Temp src      SpO2      Weight      Height      Head Circumference      Peak Flow      Pain Score 0     Pain  Loc      Pain Education      Exclude from Growth Chart    No data found.  Updated Vital Signs There were no vitals taken for this visit.  Visual Acuity Right Eye Distance:   Left Eye Distance:  Bilateral Distance:    Right Eye Near:   Left Eye Near:    Bilateral Near:     Physical Exam Vitals and nursing note reviewed.  Constitutional:      General: She is not in acute distress.    Appearance: Normal appearance.  HENT:     Head: Normocephalic.     Right Ear: Tympanic membrane, ear canal and external ear normal.     Nose: Congestion present.     Mouth/Throat:     Mouth: Mucous membranes are moist.     Pharynx: No posterior oropharyngeal erythema.  Eyes:     Extraocular Movements: Extraocular movements intact.     Conjunctiva/sclera: Conjunctivae normal.     Pupils: Pupils are equal, round, and reactive to light.  Cardiovascular:     Rate and Rhythm: Normal rate and regular rhythm.     Pulses: Normal pulses.     Heart sounds: Normal heart sounds.  Pulmonary:     Effort: Pulmonary effort is normal.     Breath sounds: Examination of the right-upper field reveals wheezing. Examination of the left-upper field reveals wheezing. Examination of the right-lower field reveals wheezing. Examination of the left-lower field reveals wheezing. Wheezing present.     Comments: DuoNeb administered, significant improvement in wheezing post DuoNeb. Abdominal:     General: Bowel sounds are normal.     Palpations: Abdomen is soft.     Tenderness: There is no abdominal tenderness.  Musculoskeletal:     Cervical back: Normal range of motion.  Skin:    General: Skin is warm and dry.  Neurological:     General: No focal deficit present.     Mental Status: She is alert and oriented to person, place, and time.  Psychiatric:        Mood and Affect: Mood normal.        Behavior: Behavior normal.      UC Treatments / Results  Labs (all labs ordered are listed, but only abnormal results  are displayed) Labs Reviewed - No data to display  EKG   Radiology No results found.  Procedures Procedures (including critical care time)  Medications Ordered in UC Medications - No data to display  Initial Impression / Assessment and Plan / UC Course  I have reviewed the triage vital signs and the nursing notes.  Pertinent labs & imaging results that were available during my care of the patient were reviewed by me and considered in my medical decision making (see chart for details).  On exam, patient with expiratory wheezing noted throughout.  DuoNeb and Solu-Medrol 125 mg IM administered.  Good improvement of patient's symptoms.  Suspect patient may be experiencing a COPD exacerbation.  Will start prednisone 40 mg for the next 5 days, azithromycin 250 mg, and Promethazine DM for her cough, and an albuterol inhaler for shortness of breath and wheezing..  Supportive care recommendations were provided and discussed with the patient to include fluids, rest, over-the-counter analgesics, and use of a humidifier during sleep.  Recommended smoking cessation for the patient.  Patient was given strict ER follow-up precautions.  Patient was in agreement with this plan of care and verbalized understanding.  All questions were answered.  Patient stable for discharge.  Final Clinical Impressions(s) / UC Diagnoses   Final diagnoses:  None   Discharge Instructions   None    ED Prescriptions   None    PDMP not reviewed this encounter.   Abran Cantor, NP 10/24/23 1552

## 2023-10-24 NOTE — Discharge Instructions (Signed)
 You were given an injection of Solu-Medrol 125 mg and a duo nebulizer treatment today. Start the prednisone on 10/25/2023. Take medication as prescribed. Increase fluids and allow for plenty of rest. Recommend continued smoking cessation while your symptoms persist. Recommend use of a humidifier in the bedroom at nighttime during sleep and sleeping elevated on pillows while symptoms persist. Go to the emergency department immediately if you experience worsening shortness of breath, difficulty breathing, or other concerns. I provided you information for a local primary care office in the area who is excepting patients.  Recommend contacting the office to schedule an appointment to establish care. Follow-up as needed.

## 2023-11-03 ENCOUNTER — Emergency Department (HOSPITAL_COMMUNITY)
Admission: EM | Admit: 2023-11-03 | Discharge: 2023-11-03 | Disposition: A | Discharge status: Home / Self Care | Attending: Emergency Medicine | Admitting: Emergency Medicine

## 2023-11-03 ENCOUNTER — Emergency Department (HOSPITAL_COMMUNITY)

## 2023-11-03 ENCOUNTER — Encounter (HOSPITAL_COMMUNITY): Payer: Self-pay | Admitting: Emergency Medicine

## 2023-11-03 ENCOUNTER — Other Ambulatory Visit: Payer: Self-pay

## 2023-11-03 DIAGNOSIS — S93401A Sprain of unspecified ligament of right ankle, initial encounter: Secondary | ICD-10-CM | POA: Diagnosis not present

## 2023-11-03 DIAGNOSIS — S99911A Unspecified injury of right ankle, initial encounter: Secondary | ICD-10-CM | POA: Diagnosis not present

## 2023-11-03 DIAGNOSIS — X501XXA Overexertion from prolonged static or awkward postures, initial encounter: Secondary | ICD-10-CM | POA: Diagnosis not present

## 2023-11-03 DIAGNOSIS — M25571 Pain in right ankle and joints of right foot: Secondary | ICD-10-CM | POA: Diagnosis present

## 2023-11-03 DIAGNOSIS — S93491A Sprain of other ligament of right ankle, initial encounter: Secondary | ICD-10-CM | POA: Diagnosis not present

## 2023-11-03 MED ORDER — NAPROXEN 250 MG PO TABS
500.0000 mg | ORAL_TABLET | Freq: Once | ORAL | Status: AC
  Administered 2023-11-03: 500 mg via ORAL
  Filled 2023-11-03: qty 2

## 2023-11-03 MED ORDER — OXYCODONE-ACETAMINOPHEN 5-325 MG PO TABS
1.0000 | ORAL_TABLET | Freq: Once | ORAL | Status: AC
  Administered 2023-11-03: 1 via ORAL
  Filled 2023-11-03: qty 1

## 2023-11-03 NOTE — ED Provider Notes (Signed)
 [place] EMERGENCY DEPARTMENT AT [facility] Provider Note   CSN: [id number] Arrival date & time: 11/03/23  0246     History  Chief Complaint  Patient presents with   Ankle/Foot Pain    Grace Evans is a 48 y.o. female.  Tripped over a toy and inverted her right ankle just PTA. Difficulty bearing weight secondary to pain in lateral right ankle and foot.         Home Medications Prior to Admission medications   Medication Sig Start Date End Date Taking? Authorizing Provider  albuterol (VENTOLIN HFA) 108 (90 Base) MCG/ACT inhaler Inhale 2 puffs into the lungs every 6 (six) hours as needed. 10/24/23   Leath-Warren, Sadie Haber, NP  amoxicillin (AMOXIL) 500 MG capsule Take 1 capsule (500 mg total) by mouth 3 (three) times daily. Patient not taking: Reported on 10/09/2023 07/18/21   Osie Cheeks  aspirin EC 81 MG EC tablet Take 1 tablet (81 mg total) by mouth daily. Swallow whole. 10/02/20   Pennie Banter, DO  atorvastatin (LIPITOR) 40 MG tablet Take 1 tablet (40 mg total) by mouth daily. 02/15/23 10/09/23  Iran Ouch, MD  azithromycin (ZITHROMAX) 250 MG tablet Take 1 tablet (250 mg total) by mouth daily. Take first 2 tablets together, then 1 every day until finished. 10/24/23   Leath-Warren, Sadie Haber, NP  cilostazol (PLETAL) 50 MG tablet Take 1 tablet (50 mg total) by mouth 2 (two) times daily. 10/09/23   Iran Ouch, MD  cyclobenzaprine (FLEXERIL) 10 MG tablet Take 0.5 tablets (5 mg total) by mouth 3 (three) times daily as needed for muscle spasms. Patient not taking: Reported on 02/15/2023 05/27/20   Triplett, Rulon Eisenmenger B, FNP  dibucaine (NUPERCAINAL) 1 % OINT Place 1 application rectally 3 (three) times daily as needed. Patient not taking: Reported on 02/15/2023 10/24/19   Menshew, Charlesetta Ivory, PA-C  lidocaine (XYLOCAINE) 2 % solution Use as directed 10 mLs in the mouth or throat every 4 (four) hours as needed (sore throat). Gargle and swallow Patient  not taking: Reported on 02/15/2023 04/05/20   Cuthriell, Delorise Royals, PA-C  meloxicam (MOBIC) 15 MG tablet Take 1 tablet (15 mg total) by mouth daily. Patient not taking: Reported on 02/15/2023 05/27/20   Kem Boroughs B, FNP  methocarbamol (ROBAXIN) 500 MG tablet Take 1 tablet (500 mg total) by mouth 2 (two) times daily. Patient not taking: Reported on 02/15/2023 01/07/20   Maxwell Caul, PA-C  nicotine (NICODERM CQ - DOSED IN MG/24 HOURS) 14 mg/24hr patch Place 1 patch (14 mg total) onto the skin daily. Patient not taking: Reported on 02/15/2023 10/02/20   Esaw Grandchild A, DO  nystatin cream (MYCOSTATIN) Apply to affected area 2 times daily Patient not taking: Reported on 02/15/2023 08/14/22   Particia Nearing, PA-C  ondansetron (ZOFRAN) 4 MG tablet Take 4 mg by mouth every 8 (eight) hours as needed for nausea or vomiting. Patient not taking: Reported on 02/15/2023    [provider]  promethazine-dextromethorphan (PROMETHAZINE-DM) 6.25-15 MG/5ML syrup Take 5 mLs by mouth 4 (four) times daily as needed. 10/24/23   Leath-Warren, Sadie Haber, NP  traMADol (ULTRAM) 50 MG tablet Take 1 tablet (50 mg total) by mouth every 6 (six) hours as needed. Patient not taking: Reported on 02/15/2023 09/12/20   Faythe Ghee, PA-C  fluticasone [facility]) 50 MCG/ACT nasal spray Place 1 spray into both nostrils 2 (two) times daily. Patient not taking: Reported on 05/16/2019 09/15/17  10/24/19  Cuthriell, Delorise Royals, PA-C      Allergies    Patient has no known allergies.    Review of Systems   Review of Systems  Physical Exam Updated Vital Signs BP 103/60   Pulse 79   Temp 98.9 F (37.2 C) (Oral)   Resp 18   Ht 5\' 6"  (1.676 m)   Wt 94 kg   SpO2 94%   BMI 33.45 kg/m  Physical Exam Vitals and nursing note reviewed.  Constitutional:      Appearance: She is well-developed.  HENT:     Head: Normocephalic and atraumatic.  Cardiovascular:     Rate and Rhythm: Normal rate and regular rhythm.   Pulmonary:     Effort: No respiratory distress.     Breath sounds: No stridor.  Abdominal:     General: There is no distension.  Musculoskeletal:     Cervical back: Normal range of motion.     Comments: Swelling and pain to right lateral malleolus, pain with passive ROM. NVI. No proximal fibular ttp.  Skin:    General: Skin is warm and dry.  Neurological:     Mental Status: She is alert.     ED Results / Procedures / Treatments   Labs (all labs ordered are listed, but only abnormal results are displayed) Labs Reviewed - No data to display  EKG None  Radiology DG Ankle Complete Right Result Date: 11/03/2023 CLINICAL DATA:  Fall, right ankle injury EXAM: RIGHT ANKLE - COMPLETE 3+ VIEW COMPARISON:  None Available. FINDINGS: There is no evidence of fracture, dislocation, or joint effusion. There is no evidence of arthropathy or other focal bone abnormality. Soft tissues are unremarkable. IMPRESSION: Negative. Electronically Signed   By: Helyn Numbers M.D.   On: 11/03/2023 03:18    Procedures Procedures    Medications Ordered in ED Medications  oxyCODONE-acetaminophen (PERCOCET/ROXICET) 5-325 MG per tablet 1 tablet (1 tablet Oral Given 11/03/23 0259)  naproxen (NAPROSYN) tablet 500 mg (500 mg Oral Given 11/03/23 0406)    ED Course/ Medical Decision Making/ A&P                                 Medical Decision Making Amount and/or Complexity of Data Reviewed Radiology: ordered.  Risk Prescription drug management.  R/O fracture.  X-ray without fracture.  Likely ankle sprain.  Stable.  Ace wrap/crutches given.  Follow-up PCP in a week if not able to bear weight for repeat evaluation and consideration of MRI to rule out serious ligamentous injury,.   Final Clinical Impression(s) / ED Diagnoses Final diagnoses:  Sprain of right ankle, unspecified ligament, initial encounter    Rx / DC Orders ED Discharge Orders     None         Jakerria Kingbird, Barbara Cower, MD 11/03/23  [phone]

## 2023-11-03 NOTE — ED Triage Notes (Signed)
 Pt states she stepped on a toy causing her to twist her R foot/ankle and fall.

## 2023-11-17 DIAGNOSIS — Z419 Encounter for procedure for purposes other than remedying health state, unspecified: Secondary | ICD-10-CM | POA: Diagnosis not present

## 2023-12-17 DIAGNOSIS — Z419 Encounter for procedure for purposes other than remedying health state, unspecified: Secondary | ICD-10-CM | POA: Diagnosis not present

## 2024-01-17 DIAGNOSIS — Z419 Encounter for procedure for purposes other than remedying health state, unspecified: Secondary | ICD-10-CM | POA: Diagnosis not present

## 2024-02-16 DIAGNOSIS — Z419 Encounter for procedure for purposes other than remedying health state, unspecified: Secondary | ICD-10-CM | POA: Diagnosis not present

## 2024-03-18 DIAGNOSIS — Z419 Encounter for procedure for purposes other than remedying health state, unspecified: Secondary | ICD-10-CM | POA: Diagnosis not present

## 2024-04-18 DIAGNOSIS — Z419 Encounter for procedure for purposes other than remedying health state, unspecified: Secondary | ICD-10-CM | POA: Diagnosis not present

## 2024-07-21 ENCOUNTER — Encounter (HOSPITAL_COMMUNITY): Payer: Self-pay

## 2024-07-21 ENCOUNTER — Emergency Department (HOSPITAL_COMMUNITY)
Admission: EM | Admit: 2024-07-21 | Discharge: 2024-07-22 | Discharge status: Against Medical Advice | Attending: Emergency Medicine | Admitting: Emergency Medicine

## 2024-07-21 ENCOUNTER — Other Ambulatory Visit: Payer: Self-pay

## 2024-07-21 ENCOUNTER — Emergency Department (HOSPITAL_COMMUNITY)

## 2024-07-21 DIAGNOSIS — R2242 Localized swelling, mass and lump, left lower limb: Secondary | ICD-10-CM | POA: Diagnosis not present

## 2024-07-21 DIAGNOSIS — M79605 Pain in left leg: Secondary | ICD-10-CM | POA: Diagnosis not present

## 2024-07-21 DIAGNOSIS — Z5321 Procedure and treatment not carried out due to patient leaving prior to being seen by health care provider: Secondary | ICD-10-CM | POA: Diagnosis not present

## 2024-07-21 LAB — PREGNANCY, URINE: Preg Test, Ur: NEGATIVE

## 2024-07-21 LAB — COMPREHENSIVE METABOLIC PANEL WITH GFR
ALT: 16 U/L (ref 0–44)
AST: 17 U/L (ref 15–41)
Albumin: 3.6 g/dL (ref 3.5–5.0)
Alkaline Phosphatase: 72 U/L (ref 38–126)
Anion gap: 8 (ref 5–15)
BUN: <5 mg/dL — ABNORMAL LOW (ref 6–20)
CO2: 30 mmol/L (ref 22–32)
Calcium: 8.8 mg/dL — ABNORMAL LOW (ref 8.9–10.3)
Chloride: 103 mmol/L (ref 98–111)
Creatinine, Ser: 0.65 mg/dL (ref 0.44–1.00)
GFR, Estimated: >60 mL/min (ref >60)
Glucose, Bld: 105 mg/dL — ABNORMAL HIGH (ref 70–99)
Potassium: 3.9 mmol/L (ref 3.5–5.1)
Sodium: 141 mmol/L (ref 135–145)
Total Bilirubin: 0.9 mg/dL (ref 0.0–1.2)
Total Protein: 6.6 g/dL (ref 6.5–8.1)

## 2024-07-21 LAB — CBC WITH DIFFERENTIAL/PLATELET
Abs Immature Granulocytes: 0.03 K/uL (ref 0.00–0.07)
Basophils Absolute: 0.1 K/uL (ref 0.0–0.1)
Basophils Relative: 1 %
Eosinophils Absolute: 0.3 K/uL (ref 0.0–0.5)
Eosinophils Relative: 3 %
HCT: 48.9 % — ABNORMAL HIGH (ref 36.0–46.0)
Hemoglobin: 16.1 g/dL — ABNORMAL HIGH (ref 12.0–15.0)
Immature Granulocytes: 0 %
Lymphocytes Relative: 25 %
Lymphs Abs: 2.9 K/uL (ref 0.7–4.0)
MCH: 30.6 pg (ref 26.0–34.0)
MCHC: 32.9 g/dL (ref 30.0–36.0)
MCV: 92.8 fL (ref 80.0–100.0)
Monocytes Absolute: 0.8 K/uL (ref 0.1–1.0)
Monocytes Relative: 7 %
Neutro Abs: 7.3 K/uL (ref 1.7–7.7)
Neutrophils Relative %: 64 %
Platelets: 282 K/uL (ref 150–400)
RBC: 5.27 MIL/uL — ABNORMAL HIGH (ref 3.87–5.11)
RDW: 13.2 % (ref 11.5–15.5)
WBC: 11.4 K/uL — ABNORMAL HIGH (ref 4.0–10.5)
nRBC: 0 % (ref 0.0–0.2)

## 2024-07-21 MED ORDER — IOHEXOL 350 MG/ML SOLN
100.0000 mL | Freq: Once | INTRAVENOUS | Status: AC | PRN
  Administered 2024-07-21: 18:00:00 100 mL via INTRAVENOUS

## 2024-07-21 NOTE — ED Notes (Signed)
Pt returned from car.

## 2024-07-21 NOTE — ED Triage Notes (Signed)
 Pt states she has an unknown growth on her left leg. Pt states it has been ongoing for over a year, denies pain, says it bothers her when she stands for a while. Pt showed picture, swelling and abscess on front of leg near pelvic area.

## 2024-07-21 NOTE — ED Notes (Signed)
 Pt decided to leave while waiting for a room.

## 2024-07-21 NOTE — ED Notes (Signed)
 Pt says she's not leaving, she is going to her car.

## 2024-07-21 NOTE — ED Provider Triage Note (Signed)
 Emergency Medicine Provider Triage Evaluation Note  Grace Evans , a 48 y.o. female  was evaluated in triage.  Pt complains of upper leg mass x2-3 years, getting larger, no change in BM and no sign of infection .  Review of Systems  Positive: Leg pain Negative: fever  Physical Exam  BP (!) 130/93 (BP Location: Right Arm)   Pulse 91   Temp 97.7 F (36.5 C)   Resp 17   SpO2 97%  Gen:   Awake, no distress   Resp:  Normal effort MSK:   Moves extremities without difficulty  Other:  Left thigh mass, soft, no cellulitis  Medical Decision Making  Medically screening exam initiated at 4:05 PM.  Appropriate orders placed.  ANESIA BLACKWELL was informed that the remainder of the evaluation will be completed by another provider, this initial triage assessment does not replace that evaluation, and the importance of remaining in the ED until their evaluation is complete.     Shermon Warren SAILOR, PA-C 07/21/24 1606

## 2024-07-30 NOTE — Progress Notes (Deleted)
" °  Cardiology Office Note   Date:  07/30/2024  ID:  Grace Evans, Grace Evans [birth year], MRN [id number] PCP: Buren Rock HERO, MD  [facility] Providers Cardiologist:  None { Click to update primary MD,subspecialty MD or APP then REFRESH:1}    History of Present Illness Grace Evans is a 48 y.o. female PAD, tobacco use, GERD, HLD who presents for follow-up.  She was previously hospitalized in February 2022 with gangrenous right fifth toe. She underwent an angiogram by Dr. Marea which revealed evidence of significant stenosis in the infrarenal abdominal aorta at least 60%. There was no significant infrainguinal disease that was noted but the blood flow was very sluggish to the right foot. The infrarenal aortic stenosis was treated with a 14 x 6 cm stent and balloon dilatation.   She underwent duplex last year that was mostly non-diagnostic due to stent artifact. CTA was performed which showed patent aortic stent with moderate ISR. ABIs 09/2023 were mildly reduced at 0.88 bilaterally.  The patient was last seen 10/09/23 reporting bilateral  leg claudication likely due to residual stenosis in the aortic stent. She was started on cilostazol  50mg  BID.     ROS: ***  Studies Reviewed      *** Risk Assessment/Calculations {Does this patient have ATRIAL FIBRILLATION?:[phone]} No BP recorded.  {Refresh Note OR Click here to enter BP  :1}***       Physical Exam VS:  There were no vitals taken for this visit.       Wt Readings from Last 3 Encounters:  11/03/23 207 lb 3.7 oz (94 kg)  10/09/23 206 lb 6.4 oz (93.6 kg)  08/27/23 207 lb 3.2 oz (94 kg)    GEN: Well nourished, well developed in no acute distress NECK: No JVD; No carotid bruits CARDIAC: ***RRR, no murmurs, rubs, gallops RESPIRATORY:  Clear to auscultation without rales, wheezing or rhonchi  ABDOMEN: Soft, non-tender, non-distended EXTREMITIES:  No edema; No deformity   ASSESSMENT AND PLAN ***    {Are you ordering a CV  Procedure (e.g. stress test, cath, DCCV, TEE, etc)?   Press F2        :[id number]}  Dispo: ***  Signed, Lyrica Mcclarty VEAR Fishman, PA-C   "

## 2024-08-04 ENCOUNTER — Ambulatory Visit: Attending: Medical | Admitting: Medical

## 2024-08-05 ENCOUNTER — Encounter: Payer: Self-pay | Admitting: Medical

## 2024-08-20 ENCOUNTER — Ambulatory Visit: Attending: Cardiology | Admitting: Cardiology

## 2024-08-20 ENCOUNTER — Encounter: Payer: Self-pay | Admitting: Cardiology

## 2024-08-20 VITALS — BP 100/78 | HR 77 | Ht 66.0 in | Wt 212.8 lb

## 2024-08-20 DIAGNOSIS — E785 Hyperlipidemia, unspecified: Secondary | ICD-10-CM | POA: Diagnosis not present

## 2024-08-20 DIAGNOSIS — I739 Peripheral vascular disease, unspecified: Secondary | ICD-10-CM | POA: Insufficient documentation

## 2024-08-20 DIAGNOSIS — Z720 Tobacco use: Secondary | ICD-10-CM | POA: Diagnosis not present

## 2024-08-20 MED ORDER — ATORVASTATIN CALCIUM 40 MG PO TABS: 40.0000 mg | ORAL_TABLET | Freq: Every day | ORAL | 3 refills | Status: AC

## 2024-08-20 MED ORDER — CILOSTAZOL 50 MG PO TABS: 50.0000 mg | ORAL_TABLET | Freq: Two times a day (BID) | ORAL | 3 refills | Status: AC

## 2024-08-20 NOTE — Progress Notes (Signed)
 " Cardiology Office Note   Date:  08/20/2024  ID:  Grace Evans, Grace Evans 05-Jun-1976, MRN [id number] PCP: Grace Rock HERO, MD  [facility] Providers Cardiologist:  None     History of Present Illness Grace Evans is a 49 y.o. female with a past medical history of tobacco use, GERD, hyperlipidemia, peripheral arterial disease, and then strong extensive cardiovascular family history, who is here today for follow-up.   She was previously hospitalized in February 2022 with gangrenous right fifth toe.  She underwent an angiogram by Dr. Marea which revealed evidence of significant stenosis in the infrarenal abdominal aorta at least 60%.  There was no significant infrainguinal disease that was noted but the blood flow was very sluggish to the right foot.  The infrarenal aortic stenosis was treated with a 14 x 6 cm stent and balloon dilatation.  She was seen in clinic 02/15/2023 after being lost to follow-up. She had had lack of health insurance and just recently got Medicaid. She continued to have complaints of severe bilateral toe pain at rest and with walking with occasional calf pain. Symptoms were slightly worse on the left than on the right. She also had intermittent dark toes colorization of her small toes. She had continued to smoke 1 pack/day and been smoking since she was 49 years old. She had also noted 1 brief episode of chest pain lasting a few minutes with rest but denied any exertional symptoms. She was scheduled for an ABI in aortoiliac duplex. She was also started on aspirin  81 mg daily, atorvastatin  40 mg daily, and continued discussion about smoking cessation.   She was previously seen 08/27/2023 overall doing well have occasional bilateral leg pain on exertion.  She noted discoloration to the right foot and toes on occasion but not to the extent prior to procedure in 2022.  She was scheduled for updated ABIs.  Last seen in clinic 10/09/2023 by Dr.Arida.  With a history of critical limb  ischemia affecting the right lower extremity she was started on cilostazol  50 mg twice daily to improve symptoms.  Smoking cessation was revisited and she was sent for an updated lipid panel.   She returns to clinic today stating that overall she has been doing fairly well.  She was evaluated in the emergency department in 11/03/2023 for right ankle sprain.  She was also evaluated in the emergency department at [facility] for an unknown growth of her leg with leg pain.  She left the emergency department without being evaluated.  She states today that since starting the cilostazol  she has decreased pain in her extremities.  Feet are warm.  She has no rest pain.  And no ulcerations to her lower extremities.  States that she has been compliant with her current medication regimen.  ROS: 10 point review of system has been reviewed and considered negative the exception was been listed in the HPI  Studies Reviewed EKG Interpretation Date/Time:  Wednesday August 20 2024 10:11:13 EST Ventricular Rate:  77 PR Interval:  134 QRS Duration:  86 QT Interval:  396 QTC Calculation: 448 R Axis:   -18  Text Interpretation: Normal sinus rhythm Low voltage QRS Inferior infarct , age undetermined Cannot rule out Anterior infarct (cited on or before 27-Jul-2004) When compared with ECG of 27-Aug-2023 11:21, No significant change was found Confirmed by Grace Evans ([id number]) on 08/20/2024 10:19:57 AM    ABI's 09/13/2023 Bilateral ABIs appear decreased compared to prior study on 04/03/23.  Summary:  Right: Resting right ankle-brachial index indicates mild right lower  extremity arterial disease. The right toe-brachial index is abnormal.   Left: Resting left ankle-brachial index indicates mild left lower  extremity arterial disease. The left toe-brachial index is normal.   CTA abd/pelvis 04/30/2023 VASCULAR 1. Prior stent graft coverage of a region of intimal disruption and wall adherent mural thrombus. The stent  graft is widely patent with only mild in stent stenosis resulting in less than 50% luminal loss. 2. No evidence of aortic aneurysm, dissection or other acute abnormality.   Aorta/IVC/Iliacs ultrasound 04/03/2023 Summary:  Abdominal Aorta: The largest aortic measurement is 5.3 cm. Suboptimal  study in which the length of the abdominal aorta appears ectatic, if not  focally aneurysmal. The largest aortic diameter has increased compared to  prior exam. Aortic measurement is  likely not accurate due to shadow artifact related to calcifications.  Recommend CTA.    ABI's 04/03/23 Summary:  Right: Resting right ankle-brachial index indicates mild right lower  extremity arterial disease. The right toe-brachial index is abnormal.   Left: Resting left ankle-brachial index indicates mild left lower  extremity arterial disease. The left toe-brachial index is normal.  Risk Assessment/Calculations           Physical Exam VS:  BP 100/78 (BP Location: Left Arm, Patient Position: Sitting, Cuff Size: Normal)   Pulse 77 Comment: 80 oximeter  Ht 5' 6 (1.676 m)   Wt 212 lb 12.8 oz (96.5 kg)   SpO2 98%   BMI 34.35 kg/m        Wt Readings from Last 3 Encounters:  08/20/24 212 lb 12.8 oz (96.5 kg)  11/03/23 207 lb 3.7 oz (94 kg)  10/09/23 206 lb 6.4 oz (93.6 kg)    GEN: Well nourished, well developed in no acute distress NECK: No JVD; No carotid bruits CARDIAC: RRR, no murmurs, rubs, gallops RESPIRATORY:  Clear to auscultation without rales, wheezing or rhonchi  ABDOMEN: Soft, non-tender, non-distended EXTREMITIES:  No edema; No deformity, dorsalis pedis and posterior tibialis pulses were dopplered on the right and the left lower extremity.  ASSESSMENT AND PLAN Peripheral arterial disease with a history of critical limb ischemia affecting the right lower extremity status post and placement to the infrarenal abdominal aorta.  Since being started on cilostazol  50 mg twice daily she states that  her pain has resolved.  Pulses are able to be obtained using the Doppler.  She has no ulcerations or open areas.  She denies any rest pain.  Repeat ABIs ordered for further evaluation of PAD.  She has been continued on aspirin  81 mg daily and atorvastatin  40 mg daily.  EKG completed today reveals sinus rhythm with rate of 77 with no acute ischemic changes noted.  Mixed hyperlipidemia with last LDL of 90.  Last lipid panel was done a year ago.  She has been sent for an updated lipid and hepatic panel today.  She has been continued on atorvastatin  40 mg daily.  Goal of LDL is less than 70.  If LDL continues to remain above goal consider adding ezetimibe 10 mg daily.  Tobacco use with ongoing cessation recommended.       Dispo: Patient returns to clinic to see MD/APP in 6 months or sooner if needed for further evaluation.  Signed, Jamiah Recore, NP   "

## 2024-08-20 NOTE — Patient Instructions (Signed)
 Medication Instructions:  Your physician recommends that you continue on your current medications as directed. Please refer to the Current Medication list given to you today.   *If you need a refill on your cardiac medications before your next appointment, please call your pharmacy*  Lab Work: Your provider would like for you to have following labs drawn today lipid and hepatic panel.   If you have labs (blood work) drawn today and your tests are completely normal, you will receive your results only by: MyChart Message (if you have MyChart) OR A paper copy in the mail If you have any lab test that is abnormal or we need to change your treatment, we will call you to review the results.  Testing/Procedures: No test ordered today   Follow-Up: At [facility], you and your health needs are our priority.  As part of our continuing mission to provide you with exceptional heart care, our providers are all part of one team.  This team includes your primary Cardiologist (physician) and Advanced Practice Providers or APPs (Physician Assistants and Nurse Practitioners) who all work together to provide you with the care you need, when you need it.  Your next appointment:   6 month(s)  Provider:   Tylene Lunch, NP

## 2024-08-21 ENCOUNTER — Ambulatory Visit: Payer: Self-pay | Admitting: Cardiology

## 2024-08-21 DIAGNOSIS — Z79899 Other long term (current) drug therapy: Secondary | ICD-10-CM

## 2024-08-21 LAB — HEPATIC FUNCTION PANEL
ALT: 11 IU/L (ref 0–32)
AST: 13 IU/L (ref 0–40)
Albumin: 3.8 g/dL — ABNORMAL LOW (ref 3.9–4.9)
Alkaline Phosphatase: 91 IU/L (ref 41–116)
Bilirubin Total: 0.7 mg/dL (ref 0.0–1.2)
Bilirubin, Direct: 0.18 mg/dL (ref 0.00–0.40)
Total Protein: 5.9 g/dL — ABNORMAL LOW (ref 6.0–8.5)

## 2024-08-21 LAB — LIPID PANEL
Chol/HDL Ratio: 4.3 ratio (ref 0.0–4.4)
Cholesterol, Total: 216 mg/dL — ABNORMAL HIGH (ref 100–199)
HDL: 50 mg/dL
LDL Chol Calc (NIH): 144 mg/dL — ABNORMAL HIGH (ref 0–99)
Triglycerides: 124 mg/dL (ref 0–149)
VLDL Cholesterol Cal: 22 mg/dL (ref 5–40)

## 2024-08-21 NOTE — Progress Notes (Signed)
 Cholesterol is elevated and bad cholesterol or LDL is increased to 144 from 98.  Please ensure she is taking atorvastatin  40 mg daily and if so add ezetimibe 10 mg daily with repeat lipid and hepatic panel in 3 months.  If she has not been taking her medication as scheduled she needs to start daily dosing with a repeat panel in 3 months.

## 2024-09-17 ENCOUNTER — Ambulatory Visit

## 2025-01-28 ENCOUNTER — Ambulatory Visit: Admitting: Cardiology
